# Patient Record
Sex: Male | Born: 1965 | Race: Black or African American | Hispanic: No | Marital: Married | Smoking: Current every day smoker
Health system: Southern US, Community
[De-identification: ages and names within clinical notes are randomized; demographics above are authoritative.]

## PROBLEM LIST (undated history)

## (undated) DIAGNOSIS — F4310 Post-traumatic stress disorder, unspecified: Secondary | ICD-10-CM

## (undated) DIAGNOSIS — N529 Male erectile dysfunction, unspecified: Secondary | ICD-10-CM

## (undated) DIAGNOSIS — I10 Essential (primary) hypertension: Secondary | ICD-10-CM

## (undated) DIAGNOSIS — E8881 Metabolic syndrome: Secondary | ICD-10-CM

## (undated) DIAGNOSIS — H8110 Benign paroxysmal vertigo, unspecified ear: Secondary | ICD-10-CM

## (undated) DIAGNOSIS — K219 Gastro-esophageal reflux disease without esophagitis: Secondary | ICD-10-CM

## (undated) DIAGNOSIS — G4733 Obstructive sleep apnea (adult) (pediatric): Secondary | ICD-10-CM

## (undated) DIAGNOSIS — E559 Vitamin D deficiency, unspecified: Secondary | ICD-10-CM

## (undated) DIAGNOSIS — E785 Hyperlipidemia, unspecified: Secondary | ICD-10-CM

## (undated) DIAGNOSIS — K760 Fatty (change of) liver, not elsewhere classified: Secondary | ICD-10-CM

## (undated) DIAGNOSIS — N400 Enlarged prostate without lower urinary tract symptoms: Secondary | ICD-10-CM

## (undated) DIAGNOSIS — E119 Type 2 diabetes mellitus without complications: Secondary | ICD-10-CM

## (undated) HISTORY — DX: Benign prostatic hyperplasia without lower urinary tract symptoms: N40.0

## (undated) HISTORY — DX: Gastro-esophageal reflux disease without esophagitis: K21.9

## (undated) HISTORY — DX: Hyperlipidemia, unspecified: E78.5

## (undated) HISTORY — DX: Post-traumatic stress disorder, unspecified: F43.10

## (undated) HISTORY — DX: Metabolic syndrome: E88.81

## (undated) HISTORY — DX: Male erectile dysfunction, unspecified: N52.9

## (undated) HISTORY — DX: Type 2 diabetes mellitus without complications: E11.9

## (undated) HISTORY — DX: Obstructive sleep apnea (adult) (pediatric): G47.33

## (undated) HISTORY — DX: Metabolic syndrome: E88.810

## (undated) HISTORY — DX: Fatty (change of) liver, not elsewhere classified: K76.0

## (undated) HISTORY — DX: Vitamin D deficiency, unspecified: E55.9

## (undated) HISTORY — PX: NO PAST SURGERIES: SHX2092

## (undated) HISTORY — DX: Benign paroxysmal vertigo, unspecified ear: H81.10

---

## 1998-10-22 ENCOUNTER — Ambulatory Visit (HOSPITAL_COMMUNITY): Admission: RE | Admit: 1998-10-22 | Discharge: 1998-10-22 | Payer: Self-pay | Admitting: Internal Medicine

## 1998-10-22 ENCOUNTER — Encounter: Admission: RE | Admit: 1998-10-22 | Discharge: 1998-10-22 | Payer: Self-pay | Admitting: Internal Medicine

## 1999-02-06 ENCOUNTER — Emergency Department (HOSPITAL_COMMUNITY): Admission: EM | Admit: 1999-02-06 | Discharge: 1999-02-06 | Payer: Self-pay | Admitting: Emergency Medicine

## 1999-02-14 ENCOUNTER — Encounter: Admission: RE | Admit: 1999-02-14 | Discharge: 1999-02-14 | Payer: Self-pay | Admitting: Hematology and Oncology

## 1999-03-01 ENCOUNTER — Encounter: Admission: RE | Admit: 1999-03-01 | Discharge: 1999-03-01 | Payer: Self-pay | Admitting: Internal Medicine

## 1999-03-07 ENCOUNTER — Ambulatory Visit (HOSPITAL_COMMUNITY): Admission: RE | Admit: 1999-03-07 | Discharge: 1999-03-07 | Payer: Self-pay | Admitting: *Deleted

## 1999-04-02 ENCOUNTER — Encounter: Admission: RE | Admit: 1999-04-02 | Discharge: 1999-04-02 | Payer: Self-pay | Admitting: Hematology and Oncology

## 1999-04-11 ENCOUNTER — Encounter: Admission: RE | Admit: 1999-04-11 | Discharge: 1999-07-10 | Payer: Self-pay | Admitting: *Deleted

## 1999-05-28 ENCOUNTER — Encounter: Admission: RE | Admit: 1999-05-28 | Discharge: 1999-05-28 | Payer: Self-pay | Admitting: Hematology and Oncology

## 1999-09-05 ENCOUNTER — Emergency Department (HOSPITAL_COMMUNITY): Admission: EM | Admit: 1999-09-05 | Discharge: 1999-09-05 | Payer: Self-pay | Admitting: Emergency Medicine

## 1999-09-10 ENCOUNTER — Encounter: Admission: RE | Admit: 1999-09-10 | Discharge: 1999-09-10 | Payer: Self-pay | Admitting: Internal Medicine

## 2000-10-09 ENCOUNTER — Encounter: Payer: Self-pay | Admitting: Emergency Medicine

## 2000-10-09 ENCOUNTER — Emergency Department (HOSPITAL_COMMUNITY): Admission: EM | Admit: 2000-10-09 | Discharge: 2000-10-09 | Payer: Self-pay | Admitting: *Deleted

## 2001-07-23 ENCOUNTER — Encounter: Payer: Self-pay | Admitting: Emergency Medicine

## 2001-07-23 ENCOUNTER — Emergency Department (HOSPITAL_COMMUNITY): Admission: EM | Admit: 2001-07-23 | Discharge: 2001-07-23 | Payer: Self-pay | Admitting: Emergency Medicine

## 2002-05-23 ENCOUNTER — Encounter: Payer: Self-pay | Admitting: *Deleted

## 2002-05-23 ENCOUNTER — Ambulatory Visit (HOSPITAL_COMMUNITY): Admission: RE | Admit: 2002-05-23 | Discharge: 2002-05-23 | Payer: Self-pay | Admitting: *Deleted

## 2006-11-30 ENCOUNTER — Emergency Department (HOSPITAL_COMMUNITY): Admission: EM | Admit: 2006-11-30 | Discharge: 2006-11-30 | Payer: Self-pay | Admitting: Emergency Medicine

## 2011-06-08 ENCOUNTER — Emergency Department (HOSPITAL_COMMUNITY)
Admission: EM | Admit: 2011-06-08 | Discharge: 2011-06-08 | Disposition: A | Discharge status: Home / Self Care | Payer: Self-pay | Attending: Emergency Medicine | Admitting: Emergency Medicine

## 2011-06-08 ENCOUNTER — Encounter: Payer: Self-pay | Admitting: *Deleted

## 2011-06-08 DIAGNOSIS — R05 Cough: Secondary | ICD-10-CM | POA: Insufficient documentation

## 2011-06-08 DIAGNOSIS — F10929 Alcohol use, unspecified with intoxication, unspecified: Secondary | ICD-10-CM

## 2011-06-08 DIAGNOSIS — H9190 Unspecified hearing loss, unspecified ear: Secondary | ICD-10-CM | POA: Insufficient documentation

## 2011-06-08 DIAGNOSIS — R059 Cough, unspecified: Secondary | ICD-10-CM | POA: Insufficient documentation

## 2011-06-08 DIAGNOSIS — F1010 Alcohol abuse, uncomplicated: Secondary | ICD-10-CM | POA: Insufficient documentation

## 2011-06-08 DIAGNOSIS — J3489 Other specified disorders of nose and nasal sinuses: Secondary | ICD-10-CM | POA: Insufficient documentation

## 2011-06-08 LAB — DIFFERENTIAL
Basophils Relative: 0 % (ref 0–1)
Lymphs Abs: 3.3 10*3/uL (ref 0.7–4.0)
Monocytes Relative: 9 % (ref 3–12)
Neutro Abs: 3.8 10*3/uL (ref 1.7–7.7)
Neutrophils Relative %: 48 % (ref 43–77)

## 2011-06-08 LAB — RAPID URINE DRUG SCREEN, HOSP PERFORMED
Cocaine: NOT DETECTED
Opiates: NOT DETECTED
Tetrahydrocannabinol: NOT DETECTED

## 2011-06-08 LAB — POCT I-STAT, CHEM 8
BUN: 16 mg/dL (ref 6–23)
Chloride: 109 mEq/L (ref 96–112)
Creatinine, Ser: 1.4 mg/dL — ABNORMAL HIGH (ref 0.50–1.35)
Glucose, Bld: 110 mg/dL — ABNORMAL HIGH (ref 70–99)
Hemoglobin: 16 g/dL (ref 13.0–17.0)
Potassium: 4 mEq/L (ref 3.5–5.1)
Sodium: 143 mEq/L (ref 135–145)

## 2011-06-08 LAB — URINE MICROSCOPIC-ADD ON

## 2011-06-08 LAB — URINALYSIS, ROUTINE W REFLEX MICROSCOPIC
Bilirubin Urine: NEGATIVE
Glucose, UA: NEGATIVE mg/dL
Specific Gravity, Urine: 1.026 (ref 1.005–1.030)
Urobilinogen, UA: 0.2 mg/dL (ref 0.0–1.0)

## 2011-06-08 LAB — ETHANOL: Alcohol, Ethyl (B): 162 mg/dL — ABNORMAL HIGH (ref 0–11)

## 2011-06-08 LAB — CBC
Hemoglobin: 14.9 g/dL (ref 13.0–17.0)
Platelets: 193 10*3/uL (ref 150–400)
RBC: 5.11 MIL/uL (ref 4.22–5.81)

## 2011-06-08 MED ORDER — SODIUM CHLORIDE 0.9 % IV BOLUS (SEPSIS): 1000.0000 mL | Freq: Once | INTRAVENOUS | Status: DC

## 2011-06-08 NOTE — ED Notes (Signed)
Reports can now suddenly hear, is back to normal

## 2011-06-08 NOTE — ED Notes (Signed)
To ed for eval of hearing loss. States he has had some hearing loss over the past cple years but noticed it completely gone this am. Denies pain.

## 2011-06-08 NOTE — ED Provider Notes (Signed)
Medical screening examination/treatment/procedure(s) were performed by non-physician practitioner and as supervising physician I was immediately available for consultation/collaboration.   Brogan Martis, MD 06/08/11 1531 

## 2011-06-08 NOTE — ED Notes (Signed)
Patient denies pain and is resting comfortably. Informed patient and/or family of status.

## 2011-06-08 NOTE — ED Notes (Signed)
Family at bedside. 

## 2011-06-08 NOTE — ED Provider Notes (Signed)
History    this is a 45 year old male presenting to the ED with chief complaints of hearing loss. Per family member, the patient has had trouble hearing for quite a long time. Family member has noticed that he talks louder than usual, has TV on loud, and this is chronic.  He drives truck for living. However, this morning patient noticed complete hearing loss to both the ears. Patient state hearing loss is acute.  He can hear vibrations, and noticed that sounds comes and goes. Patient denies ear pain, or headache. He does state he has some mild nasal congestions and nonproductive cough, but no fever, chest pain, shortness of breath, or abdominal pain. Patient does not take any medication on a regular basis. Per family member, he had a few alcoholic drinks last night, but is not a chronic drinker.    CSN: [id number] Arrival date & time: 06/08/2011  7:13 AM   First MD Initiated Contact with Patient 06/08/11 0719      Chief Complaint  Patient presents with  . Hearing Loss    (Consider location/radiation/quality/duration/timing/severity/associated sxs/prior treatment) HPI  History reviewed. No pertinent past medical history.  History reviewed. No pertinent past surgical history.  History reviewed. No pertinent family history.  History  Substance Use Topics  . Smoking status: Not on file  . Smokeless tobacco: Not on file  . Alcohol Use: Yes      Review of Systems  All other systems reviewed and are negative.    Allergies  Review of patient's allergies indicates not on file.  Home Medications  No current outpatient prescriptions on file.  BP 160/91  Pulse 90  Resp 16  SpO2 96%  Physical Exam  Nursing note and vitals reviewed. Constitutional: He is oriented to person, place, and time. He appears well-developed and well-nourished.       Awake, alert, nontoxic appearance  HENT:  Head: Normocephalic and atraumatic.  Right Ear: External ear normal. No drainage, swelling or  tenderness. No foreign bodies. Tympanic membrane is not perforated. A middle ear effusion is present. No hemotympanum. Decreased hearing is noted.  Left Ear: External ear normal. No drainage, swelling or tenderness. No foreign bodies. Tympanic membrane is not perforated. A middle ear effusion is present. No hemotympanum. Decreased hearing is noted.  Mouth/Throat: Oropharynx is clear and moist.  Eyes: Conjunctivae and EOM are normal. Pupils are equal, round, and reactive to light. Right eye exhibits no discharge. Left eye exhibits no discharge.  Neck: Neck supple.  Cardiovascular: Normal rate and regular rhythm.   Pulmonary/Chest: Effort normal and breath sounds normal. He exhibits no tenderness.  Abdominal: Bowel sounds are normal. There is no tenderness. There is no rebound.  Musculoskeletal: He exhibits no tenderness.       Baseline ROM, no obvious new focal weakness  Neurological: He is alert and oriented to person, place, and time. No cranial nerve deficit. He exhibits normal muscle tone. Coordination normal.       Mental status and motor strength appears baseline for patient and situation  Skin: Skin is warm and dry. No rash noted.  Psychiatric: He has a normal mood and affect.    ED Course  Procedures (including critical care time)  Labs Reviewed - No data to display No results found.   No diagnosis found.  Results for orders placed during the hospital encounter of 06/08/11  CBC      Component Value Range   WBC 8.0  4.0 - 10.5 (K/uL)   RBC 5.11  4.22 - 5.81 (MIL/uL)   Hemoglobin 14.9  13.0 - 17.0 (g/dL)   HCT 16.1  09.6 - 04.5 (%)   MCV 85.9  78.0 - 100.0 (fL)   MCH 29.2  26.0 - 34.0 (pg)   MCHC 33.9  30.0 - 36.0 (g/dL)   RDW 40.9  81.1 - 91.4 (%)   Platelets 193  150 - 400 (K/uL)  DIFFERENTIAL      Component Value Range   Neutrophils Relative 48  43 - 77 (%)   Neutro Abs 3.8  1.7 - 7.7 (K/uL)   Lymphocytes Relative 41  12 - 46 (%)   Lymphs Abs 3.3  0.7 - 4.0 (K/uL)    Monocytes Relative 9  3 - 12 (%)   Monocytes Absolute 0.7  0.1 - 1.0 (K/uL)   Eosinophils Relative 2  0 - 5 (%)   Eosinophils Absolute 0.1  0.0 - 0.7 (K/uL)   Basophils Relative 0  0 - 1 (%)   Basophils Absolute 0.0  0.0 - 0.1 (K/uL)  ETHANOL      Component Value Range   Alcohol, Ethyl (B) 162 (*) 0 - 11 (mg/dL)  URINE RAPID DRUG SCREEN (HOSP PERFORMED)      Component Value Range   Opiates NONE DETECTED  NONE DETECTED    Cocaine NONE DETECTED  NONE DETECTED    Benzodiazepines NONE DETECTED  NONE DETECTED    Amphetamines NONE DETECTED  NONE DETECTED    Tetrahydrocannabinol NONE DETECTED  NONE DETECTED    Barbiturates NONE DETECTED  NONE DETECTED   URINALYSIS, ROUTINE W REFLEX MICROSCOPIC      Component Value Range   Color, Urine YELLOW  YELLOW    APPearance CLEAR  CLEAR    Specific Gravity, Urine 1.026  1.005 - 1.030    pH 5.0  5.0 - 8.0    Glucose, UA NEGATIVE  NEGATIVE (mg/dL)   Hgb urine dipstick SMALL (*) NEGATIVE    Bilirubin Urine NEGATIVE  NEGATIVE    Ketones, ur NEGATIVE  NEGATIVE (mg/dL)   Protein, ur NEGATIVE  NEGATIVE (mg/dL)   Urobilinogen, UA 0.2  0.0 - 1.0 (mg/dL)   Nitrite NEGATIVE  NEGATIVE    Leukocytes, UA NEGATIVE  NEGATIVE   POCT I-STAT, CHEM 8      Component Value Range   Sodium 143  135 - 145 (mEq/L)   Potassium 4.0  3.5 - 5.1 (mEq/L)   Chloride 109  96 - 112 (mEq/L)   BUN 16  6 - 23 (mg/dL)   Creatinine, Ser 7.82 (*) 0.50 - 1.35 (mg/dL)   Glucose, Bld 956 (*) 70 - 99 (mg/dL)   Calcium, Ion 2.13 (*) 1.12 - 1.32 (mmol/L)   TCO2 22  0 - 100 (mmol/L)   Hemoglobin 16.0  13.0 - 17.0 (g/dL)   HCT 08.6  57.8 - 46.9 (%)  URINE MICROSCOPIC-ADD ON      Component Value Range   Squamous Epithelial / LPF RARE  RARE    WBC, UA 0-2  <3 (WBC/hpf)   RBC / HPF 0-2  <3 (RBC/hpf)   No results found.    MDM  Patient has acute on chronic hearing changes. He appears to hear vibrations greater than sounds. Questionable neurosensory loss versus conductive loss. No  obvious finding on exam. Mild fluid behind TM bilaterally with no significant signs of infection. Patient has no focal neuro deficit, however he is not acting as himself according to family members. Therefore, I will obtain an i-STAT, EtOH,  urine drug screen, and we'll continue to monitor. He appears to be in no acute distress, and his vital signs stable.   9:23 AM Patient has an elevated alcohol level on blood work, but no other obvious finding concerning for drug toxicity. No evidence of infection. Per family member, his hearing has improved and he was able to hear conversations without difficulty. Family members agrees to taking home and provide a safety net while he rest. Patient is encouraged to return to the ER if symptoms worsen. He is currently in no acute distress. Patient will be discharged.     Fayrene Helper, [place] 06/08/11 [phone]

## 2013-05-12 ENCOUNTER — Emergency Department (HOSPITAL_COMMUNITY)
Admission: EM | Admit: 2013-05-12 | Discharge: 2013-05-12 | Disposition: A | Discharge status: Home / Self Care | Payer: BC Managed Care – PPO | Source: Home / Self Care | Attending: Emergency Medicine | Admitting: Emergency Medicine

## 2013-05-12 ENCOUNTER — Emergency Department (INDEPENDENT_AMBULATORY_CARE_PROVIDER_SITE_OTHER): Payer: BC Managed Care – PPO

## 2013-05-12 DIAGNOSIS — J40 Bronchitis, not specified as acute or chronic: Secondary | ICD-10-CM

## 2013-05-12 MED ORDER — METHYLPREDNISOLONE (PAK) 4 MG PO TABS: ORAL_TABLET | ORAL | Status: DC

## 2013-05-12 MED ORDER — BENZONATATE 200 MG PO CAPS: 200.0000 mg | ORAL_CAPSULE | Freq: Three times a day (TID) | ORAL | Status: DC | PRN

## 2013-05-12 MED ORDER — ALBUTEROL SULFATE HFA 108 (90 BASE) MCG/ACT IN AERS: 2.0000 | INHALATION_SPRAY | RESPIRATORY_TRACT | Status: DC | PRN

## 2013-05-12 MED ORDER — HYDROCOD POLST-CHLORPHEN POLST 10-8 MG/5ML PO LQCR: 5.0000 mL | Freq: Every evening | ORAL | Status: DC | PRN

## 2013-05-12 MED ORDER — TADALAFIL 20 MG PO TABS: 20.0000 mg | ORAL_TABLET | Freq: Every day | ORAL | Status: DC | PRN

## 2013-05-12 NOTE — ED Provider Notes (Signed)
Medical screening examination/treatment/procedure(s) were performed by non-physician practitioner and as supervising physician I was immediately available for consultation/collaboration.  Leslee Home, M.D.  Reuben Likes, MD 05/12/13 [phone]

## 2013-05-12 NOTE — ED Provider Notes (Signed)
CSN: [id number]     Arrival date & time 05/12/13  0803 History   None    No chief complaint on file.  (Consider location/radiation/quality/duration/timing/severity/associated sxs/prior Treatment) HPI Comments: 47 year old otherwise healthy male presents complaining of having a cough for 2 weeks, getting significantly worse over the past week. Additionally, he admits to some slight sinus pressure, posterior nasal drainage, and fatigue/ill feeling. This began 2 weeks ago as just a dry cough. Cough has persisted, and in the past week has gotten more frequent. It has remained dry. In the past week, it has become associated with some chest pain with coughing. In the past week, he has also noticed some sinus pressure and postnasal drip when he wakes up. He has been feeling sick/tired in the past week also, he believes this could be because the cough is keeping him awake at night. He has taken numerous over-the-counter cough and cold medications without relief of his symptoms. He denies pleuritic chest pain, shortness of breath, NVD. No recent travel or sick contacts. He does smoke cigarettes, about a half pack a day.   Additionally, he is requesting a refill of his Cialis. He has been on 20 mg tablets in the past to take on an as-needed basis. He tried to get in to see his regular doctor but apparently that doctor has retired. The new physician and has taken over the retired physician's patient's would not refill this without having an appointment   No past medical history on file. No past surgical history on file. No family history on file. History  Substance Use Topics  . Smoking status: Current Every Day Smoker -- 0.50 packs/day for 15 years    Types: Cigarettes  . Smokeless tobacco: Not on file  . Alcohol Use: Yes     Comment: occasionally, holidays    Review of Systems  Constitutional: Positive for fatigue. Negative for fever and chills.  HENT: Positive for postnasal drip and sinus pressure.  Negative for ear pain, rhinorrhea and sore throat.   Eyes: Negative for visual disturbance.  Respiratory: Positive for cough. Negative for shortness of breath.   Cardiovascular: Positive for chest pain (with coughing only). Negative for palpitations and leg swelling.  Gastrointestinal: Negative for nausea, vomiting, abdominal pain, diarrhea and constipation.  Genitourinary: Negative for dysuria, urgency, frequency and hematuria.  Musculoskeletal: Negative for arthralgias, myalgias, neck pain and neck stiffness.  Skin: Negative for rash.  Neurological: Negative for dizziness, weakness and light-headedness.    Allergies  Review of patient's allergies indicates no known allergies.  Home Medications   Current Outpatient Rx  Name  Route  Sig  Dispense  Refill  . albuterol (PROVENTIL HFA;VENTOLIN HFA) 108 (90 BASE) MCG/ACT inhaler   Inhalation   Inhale 2 puffs into the lungs every 4 (four) hours as needed for wheezing.   1 Inhaler   0   . benzonatate (TESSALON) 200 MG capsule   Oral   Take 1 capsule (200 mg total) by mouth 3 (three) times daily as needed for cough.   30 capsule   0   . chlorpheniramine-HYDROcodone (TUSSIONEX PENNKINETIC ER) 10-8 MG/5ML LQCR   Oral   Take 5 mLs by mouth at bedtime as needed for cough.   115 mL   0   . methylPREDNIsolone (MEDROL DOSPACK) 4 MG tablet      follow package directions   21 tablet   0   . tadalafil (CIALIS) 20 MG tablet   Oral   Take 1 tablet (20  mg total) by mouth daily as needed for erectile dysfunction.   6 tablet   0    BP 143/90  Pulse 81  Temp(Src) 98.7 F (37.1 C) (Oral)  Resp 18  SpO2 97% Physical Exam  Nursing note and vitals reviewed. Constitutional: He is oriented to person, place, and time. He appears well-developed and well-nourished. No distress.  HENT:  Head: Normocephalic.  Right Ear: External ear normal.  Left Ear: External ear normal.  Nose: Nose normal.  Mouth/Throat: Oropharynx is clear and moist.  No oropharyngeal exudate.  Eyes: Conjunctivae are normal. Pupils are equal, round, and reactive to light. Right eye exhibits no discharge. Left eye exhibits no discharge. No scleral icterus.  Neck: Normal range of motion. Neck supple.  Cardiovascular: Normal rate, regular rhythm and normal heart sounds.   Pulmonary/Chest: Effort normal. No respiratory distress. He has no wheezes. He has rales (Left middle lung field).  Lymphadenopathy:    He has no cervical adenopathy.  Neurological: He is alert and oriented to person, place, and time. Coordination normal.  Skin: Skin is warm and dry. No rash noted. He is not diaphoretic.  Psychiatric: He has a normal mood and affect. Judgment normal.    ED Course  Procedures (including critical care time) Labs Review Labs Reviewed - No data to display Imaging Review Dg Chest 2 View  05/12/2013   CLINICAL DATA:  One week history of cough and congestion.  EXAM: CHEST  2 VIEW  COMPARISON:  Prior chest x-ray 10/09/2000  FINDINGS: The lungs are clear and negative for focal airspace consolidation, pulmonary edema or suspicious pulmonary nodule. No pleural effusion or pneumothorax. Cardiac and mediastinal contours are within normal limits. No acute fracture or lytic or blastic osseous lesions. The visualized upper abdominal bowel gas pattern is unremarkable.  IMPRESSION: No active cardiopulmonary disease.   Electronically Signed   By: Malachy Moan M.D.   On: 05/12/2013 08:51      MDM   1. Bronchitis    No radiographic evidence of pneumonia. Treat the cough with Tessalon Perles in the daytime and Tussionex at night, also Medrol Dosepak and Ventolin. Will refill a few tablets of the Cialis and he should followup with his new primary care physician    Meds ordered this encounter  Medications  . benzonatate (TESSALON) 200 MG capsule    Sig: Take 1 capsule (200 mg total) by mouth 3 (three) times daily as needed for cough.    Dispense:  30 capsule     Refill:  0    Order Specific Question:  Supervising Provider    Answer:  Lorenz Coaster, DAVID C [id number]  . chlorpheniramine-HYDROcodone (TUSSIONEX PENNKINETIC ER) 10-8 MG/5ML LQCR    Sig: Take 5 mLs by mouth at bedtime as needed for cough.    Dispense:  115 mL    Refill:  0    Order Specific Question:  Supervising Provider    Answer:  Lorenz Coaster, DAVID C [id number]  . albuterol (PROVENTIL HFA;VENTOLIN HFA) 108 (90 BASE) MCG/ACT inhaler    Sig: Inhale 2 puffs into the lungs every 4 (four) hours as needed for wheezing.    Dispense:  1 Inhaler    Refill:  0    Order Specific Question:  Supervising Provider    Answer:  Lorenz Coaster, DAVID C [id number]  . methylPREDNIsolone (MEDROL DOSPACK) 4 MG tablet    Sig: follow package directions    Dispense:  21 tablet    Refill:  0    Order Specific  Question:  Supervising Provider    Answer:  Lorenz Coaster, DAVID C [id number]  . tadalafil (CIALIS) 20 MG tablet    Sig: Take 1 tablet (20 mg total) by mouth daily as needed for erectile dysfunction.    Dispense:  6 tablet    Refill:  0    Order Specific Question:  Supervising Provider    Answer:  Lorenz Coaster, DAVID C [[id number]]       Graylon Good, PA-C 05/12/13 [phone]

## 2013-05-12 NOTE — ED Notes (Signed)
C/o cough for 2 weeks States he has been taking cough medicine but no relief Denies nasal drainage. Non productive.

## 2013-05-23 ENCOUNTER — Emergency Department (HOSPITAL_COMMUNITY): Payer: BC Managed Care – PPO

## 2013-05-23 ENCOUNTER — Encounter (HOSPITAL_COMMUNITY): Payer: Self-pay | Admitting: Emergency Medicine

## 2013-05-23 ENCOUNTER — Emergency Department (HOSPITAL_COMMUNITY)
Admission: EM | Admit: 2013-05-23 | Discharge: 2013-05-23 | Disposition: A | Discharge status: Home / Self Care | Payer: BC Managed Care – PPO | Attending: Emergency Medicine | Admitting: Emergency Medicine

## 2013-05-23 ENCOUNTER — Other Ambulatory Visit: Payer: Self-pay

## 2013-05-23 DIAGNOSIS — J209 Acute bronchitis, unspecified: Secondary | ICD-10-CM | POA: Insufficient documentation

## 2013-05-23 DIAGNOSIS — F17200 Nicotine dependence, unspecified, uncomplicated: Secondary | ICD-10-CM | POA: Insufficient documentation

## 2013-05-23 DIAGNOSIS — R404 Transient alteration of awareness: Secondary | ICD-10-CM | POA: Insufficient documentation

## 2013-05-23 DIAGNOSIS — R55 Syncope and collapse: Secondary | ICD-10-CM | POA: Insufficient documentation

## 2013-05-23 DIAGNOSIS — R05 Cough: Secondary | ICD-10-CM

## 2013-05-23 DIAGNOSIS — J40 Bronchitis, not specified as acute or chronic: Secondary | ICD-10-CM

## 2013-05-23 LAB — BASIC METABOLIC PANEL
BUN: 17 mg/dL (ref 6–23)
CO2: 25 mEq/L (ref 19–32)
Chloride: 104 mEq/L (ref 96–112)
Creatinine, Ser: 1.32 mg/dL (ref 0.50–1.35)
GFR calc Af Amer: 74 mL/min — ABNORMAL LOW (ref >90)
Potassium: 4.3 mEq/L (ref 3.5–5.1)

## 2013-05-23 LAB — CBC WITH DIFFERENTIAL/PLATELET
Basophils Relative: 0 % (ref 0–1)
HCT: 44.1 % (ref 39.0–52.0)
Hemoglobin: 15 g/dL (ref 13.0–17.0)
Lymphocytes Relative: 26 % (ref 12–46)
Lymphs Abs: 2.3 10*3/uL (ref 0.7–4.0)
MCHC: 34 g/dL (ref 30.0–36.0)
Monocytes Absolute: 0.9 10*3/uL (ref 0.1–1.0)
Monocytes Relative: 10 % (ref 3–12)
Neutro Abs: 5.3 10*3/uL (ref 1.7–7.7)
Neutrophils Relative %: 62 % (ref 43–77)
RBC: 4.98 MIL/uL (ref 4.22–5.81)
WBC: 8.6 10*3/uL (ref 4.0–10.5)

## 2013-05-23 MED ORDER — AZITHROMYCIN 250 MG PO TABS: 250.0000 mg | ORAL_TABLET | Freq: Every day | ORAL | Status: DC

## 2013-05-23 MED ORDER — AZITHROMYCIN 250 MG PO TABS
500.0000 mg | ORAL_TABLET | Freq: Once | ORAL | Status: AC
  Administered 2013-05-23: 500 mg via ORAL
  Filled 2013-05-23: qty 2

## 2013-05-23 MED ORDER — ALBUTEROL SULFATE (5 MG/ML) 0.5% IN NEBU
2.5000 mg | INHALATION_SOLUTION | Freq: Once | RESPIRATORY_TRACT | Status: AC
  Administered 2013-05-23: 2.5 mg via RESPIRATORY_TRACT
  Filled 2013-05-23: qty 0.5

## 2013-05-23 NOTE — ED Notes (Signed)
EMS dispatch called regarding pt's meds.  Family felt that EMS brought meds to hospital.  EMS dispatch verified that meds were left in the living room of the pt's home.  Family advised.

## 2013-05-23 NOTE — ED Provider Notes (Signed)
CSN: [id number]     Arrival date & time 05/23/13  0043 History   First MD Initiated Contact with Patient 05/23/13 0131     Chief Complaint  Patient presents with  . Shortness of Breath  . Loss of Consciousness   (Consider location/radiation/quality/duration/timing/severity/associated sxs/prior Treatment) HPI 47 year old male presents emergency department from home via EMS after syncopal event.  Patient reports he has had cough for the last 10 days.  He was seen last week.  The urgent care and diagnosed with bronchitis.  He is given albuterol inhaler, Tessalon Perles, Tussionex.  He reports no improvement in his symptoms.  He has had hot and cold chills, but no specific fever.  Tonight, patient has been more short of breath.  He was walking to the bathroom when he had a sudden coughing spell.  Patient then woke up on the bathroom floor.  He had an episode of nausea and vomiting and shortness of breath.  This since resolved.  No chest pain.  Patient reports persistent cough and shortness of breath associated with bronchitis.  Patient reports family history of congestive heart failure, and diabetes. He has no medical history.  He is a half pack a day smoker History reviewed. No pertinent past medical history. History reviewed. No pertinent past surgical history. No family history on file. History  Substance Use Topics  . Smoking status: Current Every Day Smoker -- 0.50 packs/day for 15 years    Types: Cigarettes  . Smokeless tobacco: Not on file  . Alcohol Use: Yes     Comment: occasionally, holidays    Review of Systems  See History of Present Illness; otherwise all other systems are reviewed and negative  Allergies  Review of patient's allergies indicates no known allergies.  Home Medications   Current Outpatient Rx  Name  Route  Sig  Dispense  Refill  . albuterol (PROVENTIL HFA;VENTOLIN HFA) 108 (90 BASE) MCG/ACT inhaler   Inhalation   Inhale 2 puffs into the lungs every 4 (four)  hours as needed for wheezing.   1 Inhaler   0   . benzonatate (TESSALON) 200 MG capsule   Oral   Take 1 capsule (200 mg total) by mouth 3 (three) times daily as needed for cough.   30 capsule   0   . chlorpheniramine-HYDROcodone (TUSSIONEX PENNKINETIC ER) 10-8 MG/5ML LQCR   Oral   Take 5 mLs by mouth at bedtime as needed for cough.   115 mL   0   . tadalafil (CIALIS) 20 MG tablet   Oral   Take 1 tablet (20 mg total) by mouth daily as needed for erectile dysfunction.   6 tablet   0    BP 151/79  Pulse 92  Temp(Src) 98.3 F (36.8 C) (Oral)  Resp 16  Ht 6\' 1"  (1.854 m)  Wt 250 lb (113.399 kg)  BMI 32.99 kg/m2  SpO2 97% Physical Exam  Constitutional: He is oriented to person, place, and time. He appears well-developed and well-nourished. He appears distressed (uncomfortable appearing).  HENT:  Head: Normocephalic and atraumatic.  Right Ear: External ear normal.  Left Ear: External ear normal.  Nose: Nose normal.  Mouth/Throat: Oropharynx is clear and moist.  Eyes: Conjunctivae and EOM are normal. Pupils are equal, round, and reactive to light.  Neck: Normal range of motion. Neck supple. No JVD present. No tracheal deviation present. No thyromegaly present.  Cardiovascular: Normal rate, regular rhythm, normal heart sounds and intact distal pulses.  Exam reveals no gallop  and no friction rub.   No murmur heard. Pulmonary/Chest: Effort normal and breath sounds normal. No stridor. No respiratory distress. He has no wheezes. He has no rales. He exhibits no tenderness.  Patient has rhonchi and cough  Abdominal: Soft. Bowel sounds are normal. He exhibits no distension and no mass. There is no tenderness. There is no rebound and no guarding.  Musculoskeletal: Normal range of motion. He exhibits no edema and no tenderness.  Lymphadenopathy:    He has no cervical adenopathy.  Neurological: He is alert and oriented to person, place, and time. No cranial nerve deficit. He exhibits  normal muscle tone. Coordination normal.  Skin: Skin is warm and dry. No rash noted. No erythema. No pallor.  Psychiatric: He has a normal mood and affect. His behavior is normal. Judgment and thought content normal.    ED Course  Procedures (including critical care time) Labs Review Labs Reviewed  BASIC METABOLIC PANEL - Abnormal; Notable for the following:    Glucose, Bld 108 (*)    GFR calc non Af Amer 64 (*)    GFR calc Af Amer 74 (*)    All other components within normal limits  CBC WITH DIFFERENTIAL  TROPONIN I  D-DIMER, QUANTITATIVE   Imaging Review Dg Chest Port 1 View  05/23/2013   CLINICAL DATA:  Shortness of breath and syncope.  EXAM: PORTABLE CHEST - 1 VIEW  COMPARISON:  05/12/2013  FINDINGS: Shallow inspiration. Heart size and pulmonary vascularity are normal. No evidence of consolidation or airspace disease in the lungs. No blunting of costophrenic angles.  IMPRESSION: No active disease.   Electronically Signed   By: Burman Nieves M.D.   On: 05/23/2013 02:13    EKG Interpretation    Date/Time:  Monday May 23 2013 02:01:41 EST Ventricular Rate:  108 PR Interval:  144 QRS Duration: 75 QT Interval:  330 QTC Calculation: 442 R Axis:   65 Text Interpretation:  Sinus tachycardia Anteroseptal infarct, old Confirmed by Herberth Deharo  MD, Yojan Paskett ([id number]) on 05/23/2013 2:03:59 AM            MDM   1. Cough syncope   2. Bronchitis    47 year old male with episode of syncope was a click related to his cough.  There is some rhonchi.  On exam.  He had chest x-ray done on the 13th that did not show any infiltrate.  Will repeat chest x-ray today.  Will also get baseline labs, and d-dimer.  Will give breathing treatment here to see if that helps with his cough.    Olivia Mackie, MD 05/23/13 [phone]

## 2013-08-22 ENCOUNTER — Telehealth: Payer: Self-pay

## 2013-08-22 NOTE — Telephone Encounter (Signed)
Left message for call back Non-identifiable  New patient 

## 2013-08-23 ENCOUNTER — Ambulatory Visit: Payer: BC Managed Care – PPO | Admitting: Internal Medicine

## 2013-08-26 ENCOUNTER — Ambulatory Visit: Payer: BC Managed Care – PPO | Admitting: Internal Medicine

## 2013-08-29 NOTE — Telephone Encounter (Signed)
Appt cancelled and rescheduled

## 2013-09-16 ENCOUNTER — Telehealth: Payer: Self-pay

## 2013-09-16 NOTE — Telephone Encounter (Signed)
Medication List and allergies:  Reviewed and updated  90 day supply/mail order: na Local prescriptions: [facility]  Immunizations due: see below  A/P:   Entered FH, PSH an personal hx Flu vaccine--did not get this season Tdap--3-4 years ago Family history of HTN--father and 2 brothers  To Discuss with Provider: Not at this time

## 2013-09-19 ENCOUNTER — Ambulatory Visit (INDEPENDENT_AMBULATORY_CARE_PROVIDER_SITE_OTHER): Payer: BC Managed Care – PPO | Admitting: Internal Medicine

## 2013-09-19 ENCOUNTER — Encounter: Payer: Self-pay | Admitting: Internal Medicine

## 2013-09-19 VITALS — BP 145/93 | HR 70 | Temp 97.9°F | Ht 74.5 in | Wt 254.4 lb

## 2013-09-19 DIAGNOSIS — R059 Cough, unspecified: Secondary | ICD-10-CM

## 2013-09-19 DIAGNOSIS — R05 Cough: Secondary | ICD-10-CM

## 2013-09-19 MED ORDER — ALBUTEROL SULFATE HFA 108 (90 BASE) MCG/ACT IN AERS: 2.0000 | INHALATION_SPRAY | Freq: Four times a day (QID) | RESPIRATORY_TRACT | Status: DC | PRN

## 2013-09-19 MED ORDER — OMEPRAZOLE 40 MG PO CPDR: 40.0000 mg | DELAYED_RELEASE_CAPSULE | Freq: Every day | ORAL | Status: DC

## 2013-09-19 NOTE — Progress Notes (Signed)
Subjective:    Patient ID: Wesley Swanson, male    DOB: 1965-11-24, 48 y.o.   MRN: [id number]  DOS:  09/19/2013 Type of  visit: New patient His main concern is cough, having cough since approximately November when he was diagnosed with bronchitis, sx are  day or night, usually dry w/  no sputum. On chart review, he went to the ER in November after a syncopal episode in the setting of bronchitis. Chest x-ray, d-dimer and EKG were within normal; no further syncope episodes.    ROS Denies sinus pain, congestion or postnasal dripping. He has quite noticeable heartburn approximately once a month. He has gained weight lately. Denies dysphasia or odynophagia. Admits to occasional wheezing although has no history of asthma.   Past Medical History  Diagnosis Date  . ED (erectile dysfunction)     Past Surgical History  Procedure Laterality Date  . No past surgeries      History   Social History  . Marital Status: Married    Spouse Name: Natalia Leatherwood     Number of Children: 0  . Years of Education: N/A   Occupational History  . [employer] doctor    Social History Main Topics  . Smoking status: Current Every Day Smoker -- 1.00 packs/day for 18 years    Types: Cigarettes  . Smokeless tobacco: Never Used     Comment: 1 ppd  . Alcohol Use: 1.5 - 2 oz/week    3-4 drink(s) per week     Comment: occasionally, holidays  . Drug Use: No  . Sexual Activity: Not on file   Other Topics Concern  . Not on file   Social History Narrative  . No narrative on file     Family History  Problem Relation Age of Onset  . Hypertension Father   . Diabetes Father   . Hypertension Brother   . CAD Father     MI age 98  . Stroke Neg Hx   . Colon cancer Neg Hx   . Prostate cancer Neg Hx        Medication List       This list is accurate as of: 09/19/13  6:49 PM.  Always use your most recent med list.               albuterol 108 (90 BASE) MCG/ACT inhaler  Commonly known as:  VENTOLIN  HFA  Inhale 2 puffs into the lungs every 6 (six) hours as needed for wheezing or shortness of breath.     omeprazole 40 MG capsule  Commonly known as:  PRILOSEC  Take 1 capsule (40 mg total) by mouth daily.     tadalafil 20 MG tablet  Commonly known as:  CIALIS  Take 1 tablet (20 mg total) by mouth daily as needed for erectile dysfunction.           Objective:   Physical Exam BP 145/93  Pulse 70  Temp(Src) 97.9 F (36.6 C) (Oral)  Ht 6' 2.5" (1.892 m)  Wt 254 lb 6.4 oz (115.395 kg)  BMI 32.24 kg/m2  SpO2 97% General -- alert, well-developed, NAD.   HEENT-- Not pale. TMs normal, throat symmetric, no redness or discharge. Face symmetric, sinuses not tender to palpation. Nose not congested. Lungs -- normal respiratory effort, no intercostal retractions, no accessory muscle use, and normal breath sounds.  Heart-- normal rate, regular rhythm, no murmur.  Abdomen-- Not distended, good bowel sounds,soft, non-tender.  Extremities-- no pretibial edema bilaterally  Neurologic--  alert & oriented X3. Speech normal, gait normal, strength normal in all extremities.  Psych-- Cognition and judgment appear intact. Cooperative with normal attention span and concentration. No anxious or depressed appearing.     Assessment & Plan:

## 2013-09-19 NOTE — Progress Notes (Signed)
Pre visit review using our clinic review tool, if applicable. No additional management support is needed unless otherwise documented below in the visit note. 

## 2013-09-19 NOTE — Assessment & Plan Note (Addendum)
48 year old gentleman, smoker, present with chronic cough since November after he had bronchitis. Review of systems is + for occ sx of GERD and wheezing. Plan: Chest x-ray PFTs if no better  Tobacco cessation discussed today Mucinex DM twice a day x 1 week PPIs daily, albuterol if wheezing, reassess in one month when he comes back for a physical

## 2013-09-19 NOTE — Patient Instructions (Signed)
Get the XR at [facility] [place], corner of [address] (10 minutes form here); they are open 24/7 [address]  [place], [place] [id number] [phone]   Mucinex DM one tablet twice a day for one week Omeprazole 40 mg one tablet before breakfast every day Albuterol, 2 puffs as needed up to 4 times a day for cough and wheezing.   Next visit is for a physical exam in 4 weeks,  fasting Please make an appointment

## 2013-10-07 ENCOUNTER — Ambulatory Visit (HOSPITAL_BASED_OUTPATIENT_CLINIC_OR_DEPARTMENT_OTHER)
Admission: RE | Admit: 2013-10-07 | Discharge: 2013-10-07 | Disposition: A | Discharge status: Home / Self Care | Payer: BC Managed Care – PPO | Source: Ambulatory Visit | Attending: Internal Medicine | Admitting: Internal Medicine

## 2013-10-07 ENCOUNTER — Telehealth: Payer: Self-pay | Admitting: *Deleted

## 2013-10-07 DIAGNOSIS — J14 Pneumonia due to Hemophilus influenzae: Secondary | ICD-10-CM

## 2013-10-07 DIAGNOSIS — J189 Pneumonia, unspecified organism: Secondary | ICD-10-CM

## 2013-10-07 DIAGNOSIS — R059 Cough, unspecified: Secondary | ICD-10-CM | POA: Insufficient documentation

## 2013-10-07 DIAGNOSIS — R05 Cough: Secondary | ICD-10-CM | POA: Insufficient documentation

## 2013-10-07 MED ORDER — DOXYCYCLINE HYCLATE 100 MG PO TABS: 100.0000 mg | ORAL_TABLET | Freq: Two times a day (BID) | ORAL | Status: DC

## 2013-10-07 NOTE — Telephone Encounter (Signed)
Pt notified of lab results and rx sent.

## 2013-10-17 ENCOUNTER — Telehealth: Payer: Self-pay | Admitting: *Deleted

## 2013-10-17 NOTE — Telephone Encounter (Signed)
No changes since last month's call.

## 2013-10-17 NOTE — Telephone Encounter (Signed)
Wende MottGaye H Foster, RN at 09/16/2013 11:45 AM     Status: Signed               Medication List and allergies: Reviewed and updated  90 day supply/mail order: na  [facility]  Immunizations due: see below  A/P:  Entered FH, PSH an personal hx  Flu vaccine--did not get this season  Tdap--3-4 years ago  Family history of HTN--father and 2 brothers  To Discuss with Provider:  Not at this time

## 2013-10-18 ENCOUNTER — Ambulatory Visit (INDEPENDENT_AMBULATORY_CARE_PROVIDER_SITE_OTHER): Payer: BC Managed Care – PPO | Admitting: Internal Medicine

## 2013-10-18 ENCOUNTER — Encounter: Payer: Self-pay | Admitting: Internal Medicine

## 2013-10-18 VITALS — BP 151/96 | HR 86 | Temp 97.9°F | Ht 72.8 in | Wt 251.0 lb

## 2013-10-18 DIAGNOSIS — Z0000 Encounter for general adult medical examination without abnormal findings: Secondary | ICD-10-CM

## 2013-10-18 DIAGNOSIS — R030 Elevated blood-pressure reading, without diagnosis of hypertension: Secondary | ICD-10-CM

## 2013-10-18 DIAGNOSIS — R059 Cough, unspecified: Secondary | ICD-10-CM

## 2013-10-18 DIAGNOSIS — R05 Cough: Secondary | ICD-10-CM

## 2013-10-18 DIAGNOSIS — I10 Essential (primary) hypertension: Secondary | ICD-10-CM | POA: Insufficient documentation

## 2013-10-18 DIAGNOSIS — IMO0001 Reserved for inherently not codable concepts without codable children: Secondary | ICD-10-CM

## 2013-10-18 LAB — COMPREHENSIVE METABOLIC PANEL
ALT: 56 U/L — AB (ref 0–53)
AST: 54 U/L — ABNORMAL HIGH (ref 0–37)
Albumin: 4.3 g/dL (ref 3.5–5.2)
Alkaline Phosphatase: 86 U/L (ref 39–117)
BILIRUBIN TOTAL: 0.8 mg/dL (ref 0.3–1.2)
BUN: 14 mg/dL (ref 6–23)
CO2: 25 meq/L (ref 19–32)
CREATININE: 1.3 mg/dL (ref 0.4–1.5)
Calcium: 9.3 mg/dL (ref 8.4–10.5)
Chloride: 104 mEq/L (ref 96–112)
GFR: 78.45 mL/min (ref >60.00)
Glucose, Bld: 90 mg/dL (ref 70–99)
Potassium: 4.1 mEq/L (ref 3.5–5.1)
Sodium: 138 mEq/L (ref 135–145)
Total Protein: 7.6 g/dL (ref 6.0–8.3)

## 2013-10-18 LAB — TSH: TSH: 1.15 u[IU]/mL (ref 0.35–5.50)

## 2013-10-18 LAB — LIPID PANEL
CHOL/HDL RATIO: 4
Cholesterol: 179 mg/dL (ref 0–200)
HDL: 50.9 mg/dL (ref >39.00)
LDL Cholesterol: 113 mg/dL — ABNORMAL HIGH (ref 0–99)
TRIGLYCERIDES: 78 mg/dL (ref 0.0–149.0)
VLDL: 15.6 mg/dL (ref 0.0–40.0)

## 2013-10-18 NOTE — Patient Instructions (Addendum)
Get your blood work before you leave   Need to do a chest XR by 11-06-13, please call the office   Check the  blood pressure 2 or 3 times a month   be sure it is between 110/60 and 140/85. Ideal blood pressure is 120/80. If it is consistently higher or lower, let me know  Next visit is for routine check up regards cough in 6 months  No need to come back fasting Please make an appointment      Smoking Cessation Quitting smoking is important to your health and has many advantages. However, it is not always easy to quit since nicotine is a very addictive drug. Often times, people try 3 times or more before being able to quit. This document explains the best ways for you to prepare to quit smoking. Quitting takes hard work and a lot of effort, but you can do it. ADVANTAGES OF QUITTING SMOKING  You will live longer, feel better, and live better.  Your body will feel the impact of quitting smoking almost immediately.  Within 20 minutes, blood pressure decreases. Your pulse returns to its normal level.  After 8 hours, carbon monoxide levels in the blood return to normal. Your oxygen level increases.  After 24 hours, the chance of having a heart attack starts to decrease. Your breath, hair, and body stop smelling like smoke.  After 48 hours, damaged nerve endings begin to recover. Your sense of taste and smell improve.  After 72 hours, the body is virtually free of nicotine. Your bronchial tubes relax and breathing becomes easier.  After 2 to 12 weeks, lungs can hold more air. Exercise becomes easier and circulation improves.  The risk of having a heart attack, stroke, cancer, or lung disease is greatly reduced.  After 1 year, the risk of coronary heart disease is cut in half.  After 5 years, the risk of stroke falls to the same as a nonsmoker.  After 10 years, the risk of lung cancer is cut in half and the risk of other cancers decreases significantly.  After 15 years, the risk of  coronary heart disease drops, usually to the level of a nonsmoker.  If you are pregnant, quitting smoking will improve your chances of having a healthy baby.  The people you live with, especially any children, will be healthier.  You will have extra money to spend on things other than cigarettes. QUESTIONS TO THINK ABOUT BEFORE ATTEMPTING TO QUIT You may want to talk about your answers with your caregiver.  Why do you want to quit?  If you tried to quit in the past, what helped and what did not?  What will be the most difficult situations for you after you quit? How will you plan to handle them?  Who can help you through the tough times? Your family? Friends? A caregiver?  What pleasures do you get from smoking? What ways can you still get pleasure if you quit? Here are some questions to ask your caregiver:  How can you help me to be successful at quitting?  What medicine do you think would be best for me and how should I take it?  What should I do if I need more help?  What is smoking withdrawal like? How can I get information on withdrawal? GET READY  Set a quit date.  Change your environment by getting rid of all cigarettes, ashtrays, matches, and lighters in your home, car, or work. Do not let people smoke in  your home.  Review your past attempts to quit. Think about what worked and what did not. GET SUPPORT AND ENCOURAGEMENT You have a better chance of being successful if you have help. You can get support in many ways.  Tell your family, friends, and co-workers that you are going to quit and need their support. Ask them not to smoke around you.  Get individual, group, or telephone counseling and support. Programs are available at [facility]. Call your local health department for information about programs in your area.  Spiritual beliefs and practices may help some smokers quit.  Download a "quit meter" on your computer to keep track of quit  statistics, such as how long you have gone without smoking, cigarettes not smoked, and money saved.  Get a self-help book about quitting smoking and staying off of tobacco. LEARN NEW SKILLS AND BEHAVIORS  Distract yourself from urges to smoke. Talk to someone, go for a walk, or occupy your time with a task.  Change your normal routine. Take a different route to work. Drink tea instead of coffee. Eat breakfast in a different place.  Reduce your stress. Take a hot bath, exercise, or read a book.  Plan something enjoyable to do every day. Reward yourself for not smoking.  Explore interactive web-based programs that specialize in helping you quit. GET MEDICINE AND USE IT CORRECTLY Medicines can help you stop smoking and decrease the urge to smoke. Combining medicine with the above behavioral methods and support can greatly increase your chances of successfully quitting smoking.  Nicotine replacement therapy helps deliver nicotine to your body without the negative effects and risks of smoking. Nicotine replacement therapy includes nicotine gum, lozenges, inhalers, nasal sprays, and skin patches. Some may be available over-the-counter and others require a prescription.  Antidepressant medicine helps people abstain from smoking, but how this works is unknown. This medicine is available by prescription.  Nicotinic receptor partial agonist medicine simulates the effect of nicotine in your brain. This medicine is available by prescription. Ask your caregiver for advice about which medicines to use and how to use them based on your health history. Your caregiver will tell you what side effects to look out for if you choose to be on a medicine or therapy. Carefully read the information on the package. Do not use any other product containing nicotine while using a nicotine replacement product.  RELAPSE OR DIFFICULT SITUATIONS Most relapses occur within the first 3 months after quitting. Do not be  discouraged if you start smoking again. Remember, most people try several times before finally quitting. You may have symptoms of withdrawal because your body is used to nicotine. You may crave cigarettes, be irritable, feel very hungry, cough often, get headaches, or have difficulty concentrating. The withdrawal symptoms are only temporary. They are strongest when you first quit, but they will go away within 10 14 days. To reduce the chances of relapse, try to:  Avoid drinking alcohol. Drinking lowers your chances of successfully quitting.  Reduce the amount of caffeine you consume. Once you quit smoking, the amount of caffeine in your body increases and can give you symptoms, such as a rapid heartbeat, sweating, and anxiety.  Avoid smokers because they can make you want to smoke.  Do not let weight gain distract you. Many smokers will gain weight when they quit, usually less than 10 pounds. Eat a healthy diet and stay active. You can always lose the weight gained after you quit.  Find ways to improve your mood other than smoking. FOR MORE INFORMATION  www.smokefree.gov  Document Released: 06/10/2001 Document Revised: 12/16/2011 Document Reviewed: 09/25/2011 [facility] Patient Information 2014 [place], [place].

## 2013-10-18 NOTE — Assessment & Plan Note (Addendum)
Since the last time he was here, he was found to have pneumonia, status post doxycycline. Cough much improved, almost gone. Denies wheezing or GERD consequently pt decided not to use albuterol, omeprazole. Plan: Chest x-ray  11-06-13

## 2013-10-18 NOTE — Assessment & Plan Note (Addendum)
Td ~ 2010 per pt Never had a cscope  Tobacco -- improving, has cut down, praised  Diet-exercise discussed  Labs

## 2013-10-18 NOTE — Progress Notes (Signed)
   Subjective:    Patient ID: Wesley Swanson, male    DOB: 08/27/1965, 48 y.o.   MRN: [id number]  DOS:  10/18/2013 Type of  visit: CPX Cough-PNM-- see a/p   ROS Diet, Exercise-- planning to improve diet, run (has gained wt since he change jobs) No  CP, SOB No palpitations, no lower extremity edema Denies  nausea, vomiting diarrhea No abdominal pain Denies  blood in the stools No GERD  Sx. No dysphagia or odynophagia (-)  sputum production (-) wheezing, chest congestion No dysuria, gross hematuria, difficulty urinating  No anxiety, depression    Past Medical History  Diagnosis Date  . ED (erectile dysfunction)     Past Surgical History  Procedure Laterality Date  . No past surgeries      History   Social History  . Marital Status: Married    Spouse Name: Natalia LeatherwoodKatherine     Number of Children: 0  . Years of Education: N/A   Occupational History  . 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 Smoking status: Current Every Day Smoker -- 1.00 packs/day for 18 years    Types: Cigarettes  . Smokeless tobacco: Never Used     Comment: 1 ppd  . Alcohol Use: 1.5 - 2.0 oz/week    3-4 drink(s) per week     Comment: occasionally, holidays  . Drug Use: No  . Sexual Activity: Not on file   Other Topics Concern  . Not on file   Social History Narrative  . No narrative on file     Family History  Problem Relation Age of Onset  . Hypertension Father   . Diabetes Father   . Hypertension Brother   . CAD Father     MI age 46  . Stroke Neg Hx   . Colon cancer Neg Hx   . Prostate cancer Neg Hx       Medication List       This list is accurate as of: 10/18/13  9:31 PM.  Always use your most recent med list.               albuterol 108 (90 BASE) MCG/ACT inhaler  Commonly known as:  VENTOLIN HFA  Inhale 2 puffs into the lungs every 6 (six) hours as needed for wheezing or shortness of breath.     tadalafil 20 MG tablet  Commonly known as:  CIALIS    Take 1 tablet (20 mg total) by mouth daily as needed for erectile dysfunction.           Objective:   Physical Exam BP 151/96  Pulse 86  Temp(Src) 97.9 F (36.6 C)  Ht 6' 0.8" (1.849 m)  Wt 251 lb (113.853 kg)  BMI 33.30 kg/m2  SpO2 99%  General -- alert, well-developed, NAD.  Neck --no thyromegaly  HEENT-- Not pale.  Lungs -- normal respiratory effort, no intercostal retractions, no accessory muscle use, and normal breath sounds.  Heart-- normal rate, regular rhythm, no murmur.  Abdomen-- Not distended, good bowel sounds,soft, non-tender. Neurologic--  alert & oriented X3. Speech normal, gait normal, strength normal in all extremities.  Psych-- Cognition and judgment appear intact. Cooperative with normal attention span and concentration. No anxious or depressed appearing.       Assessment & Plan:

## 2013-10-18 NOTE — Assessment & Plan Note (Signed)
BP elevated today, patient assures me that he take his blood pressure in the ambulatory setting and always gets readings of ~ 128/80 Plan: Continue monitoring

## 2013-10-20 ENCOUNTER — Encounter: Payer: Self-pay | Admitting: *Deleted

## 2013-10-21 ENCOUNTER — Telehealth: Payer: Self-pay | Admitting: Internal Medicine

## 2013-10-21 MED ORDER — TADALAFIL 20 MG PO TABS: 20.0000 mg | ORAL_TABLET | Freq: Every day | ORAL | Status: DC | PRN

## 2013-10-21 NOTE — Telephone Encounter (Signed)
Done

## 2013-10-21 NOTE — Telephone Encounter (Signed)
Caller name:Pearly Cobbins Relation to ZO:XWRUEAVpt:patient PCP:Dr Paz Call back [phone] [facility]acy[id number] [place], [place] - [address]   Reason for call: Patient called to request a refill for Cialis

## 2013-11-12 ENCOUNTER — Telehealth: Payer: Self-pay | Admitting: Internal Medicine

## 2013-11-12 NOTE — Telephone Encounter (Signed)
Needs a CXR-- dx pneumonia Please arrange

## 2013-11-18 NOTE — Telephone Encounter (Signed)
CXR already ordered. Pt states that he forgot will try to get cxr done as soon as he can.

## 2013-11-22 ENCOUNTER — Encounter: Payer: Self-pay | Admitting: Podiatry

## 2013-11-22 ENCOUNTER — Ambulatory Visit (INDEPENDENT_AMBULATORY_CARE_PROVIDER_SITE_OTHER): Payer: BC Managed Care – PPO | Admitting: Podiatry

## 2013-11-22 ENCOUNTER — Ambulatory Visit (INDEPENDENT_AMBULATORY_CARE_PROVIDER_SITE_OTHER): Payer: BC Managed Care – PPO

## 2013-11-22 VITALS — BP 155/104 | HR 82 | Resp 16 | Ht 73.0 in | Wt 251.0 lb

## 2013-11-22 DIAGNOSIS — M779 Enthesopathy, unspecified: Secondary | ICD-10-CM

## 2013-11-22 DIAGNOSIS — L84 Corns and callosities: Secondary | ICD-10-CM

## 2013-11-22 DIAGNOSIS — M7750 Other enthesopathy of unspecified foot: Secondary | ICD-10-CM

## 2013-11-22 DIAGNOSIS — M778 Other enthesopathies, not elsewhere classified: Secondary | ICD-10-CM

## 2013-11-22 DIAGNOSIS — M898X9 Other specified disorders of bone, unspecified site: Secondary | ICD-10-CM

## 2013-11-22 DIAGNOSIS — M2040 Other hammer toe(s) (acquired), unspecified foot: Secondary | ICD-10-CM

## 2013-11-22 NOTE — Progress Notes (Signed)
   Subjective:    Patient ID: Wesley Swanson, male    DOB: 1965/08/31, 48 y.o.   MRN: [id number]  HPI Comments: "I have pain in the toe and underneath the foot"  Patient c/o aching sub 5th MPJ and 5th toe right foot for few months. There is a callused area in between the 4th and 5th toes and sub 5th MPJ. He has had hammer toe surgery in the past for a corn. Shoes are uncomfortable, makes toes rub together. He has used OTC corn drops that made it turn white and peel.  Toe Pain       Review of Systems  Respiratory: Positive for cough.   All other systems reviewed and are negative.      Objective:   Physical Exam: I have reviewed his past medical history medications allergies surgeries social history and review of systems. Pulses are palpable. Neurologic sensorium is intact per since once the monofilament left foot. Deep tendon reflexes are intact left foot. Orthopedic evaluation demonstrates all joints distal to the ankle have a full range of motion without crepitation. A history of an arthroplasty fifth digit left foot is currently resulting in a soft tissue increase in density or soft corn between the fourth and fifth toes of the left foot. Radiographic evaluation demonstrates swelling medial aspect of the PIPJ fifth left. Cutaneous evaluation also demonstrates breakdown of soft tissue between the toes.        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 Capsulitis PIPJ fifth digit left.  Plan: Injected PIPJ with dexamethasone and local anesthetic today debrided the reactive hyperkeratosis. Discussed the possible need for surgical intervention and he will followup with me as needed.

## 2014-01-31 ENCOUNTER — Ambulatory Visit (INDEPENDENT_AMBULATORY_CARE_PROVIDER_SITE_OTHER): Payer: BC Managed Care – PPO | Admitting: Podiatry

## 2014-01-31 VITALS — BP 128/89 | HR 80 | Resp 16

## 2014-01-31 DIAGNOSIS — M898X9 Other specified disorders of bone, unspecified site: Secondary | ICD-10-CM

## 2014-01-31 DIAGNOSIS — M7750 Other enthesopathy of unspecified foot: Secondary | ICD-10-CM

## 2014-01-31 DIAGNOSIS — L84 Corns and callosities: Secondary | ICD-10-CM

## 2014-01-31 DIAGNOSIS — M779 Enthesopathy, unspecified: Secondary | ICD-10-CM

## 2014-01-31 DIAGNOSIS — M778 Other enthesopathies, not elsewhere classified: Secondary | ICD-10-CM

## 2014-01-31 NOTE — Progress Notes (Signed)
He presents today complaining of a painful lesion between the fourth and fifth digits of the left foot. Same lesion that he had last time he was in.  Objective: Vital signs are stable he is alert and oriented x3 on physical examination he demonstrates pain on palpation and range of motion of the fifth metatarsophalangeal joint. He also has a soft tissue lesion in the sulcus between the fourth and fifth digits of the left foot. This is a heloma molle.  Assessment: Capsulitis fifth metatarsophalangeal joint left heloma molle fourth interdigital space left foot.  Plan: Discussed etiology pathology conservative versus surgical therapies. We injected dexam discussed the need for surgical intervention consisting of a wedding procedure and arthroplasty.ethasone and local anesthetic to the fifth metatarsophalangeal joint area. Debrided the reactive hyperkeratosis between the toes.

## 2014-04-19 ENCOUNTER — Ambulatory Visit: Payer: BC Managed Care – PPO | Admitting: Internal Medicine

## 2014-04-19 ENCOUNTER — Telehealth: Payer: Self-pay | Admitting: *Deleted

## 2014-04-19 DIAGNOSIS — Z0289 Encounter for other administrative examinations: Secondary | ICD-10-CM

## 2014-04-19 NOTE — Telephone Encounter (Signed)
Pt called to reschedule appointment 04/19/2014 at 1:15pm for 6 month follow up at 8:03am. Rescheduled for 04/26/2014 at 2:15pm

## 2014-04-26 ENCOUNTER — Telehealth: Payer: Self-pay | Admitting: Internal Medicine

## 2014-04-26 ENCOUNTER — Ambulatory Visit: Payer: BC Managed Care – PPO | Admitting: Internal Medicine

## 2014-04-26 NOTE — Telephone Encounter (Signed)
Pt was supposed to be seen on 04/19/2014 and today 04/26/2014, he has either cancelled or no showed both appts. According to last OV notes on 09/2013 he was supposed to return in 6 months, he will need to see Dr. Drue NovelPaz and speak with him about his Cialis refill since not only is he due for OV but also the medication is not on his list.

## 2014-04-26 NOTE — Telephone Encounter (Signed)
Pt is needing new rx for cialis, [facility], states dr. Drue NovelPaz written him an rx about 4 months ago, however it does not show in his med list.

## 2014-05-01 NOTE — Telephone Encounter (Signed)
Tried calling the pt mailbox is full could not leave a message.

## 2014-06-07 ENCOUNTER — Ambulatory Visit (HOSPITAL_BASED_OUTPATIENT_CLINIC_OR_DEPARTMENT_OTHER)
Admission: RE | Admit: 2014-06-07 | Discharge: 2014-06-07 | Disposition: A | Discharge status: Home / Self Care | Payer: BC Managed Care – PPO | Source: Ambulatory Visit | Attending: Internal Medicine | Admitting: Internal Medicine

## 2014-06-07 ENCOUNTER — Ambulatory Visit (INDEPENDENT_AMBULATORY_CARE_PROVIDER_SITE_OTHER): Payer: BC Managed Care – PPO | Admitting: Internal Medicine

## 2014-06-07 ENCOUNTER — Encounter: Payer: Self-pay | Admitting: Internal Medicine

## 2014-06-07 VITALS — BP 158/88 | HR 83 | Temp 98.5°F | Wt 241.4 lb

## 2014-06-07 DIAGNOSIS — R05 Cough: Secondary | ICD-10-CM | POA: Insufficient documentation

## 2014-06-07 DIAGNOSIS — G479 Sleep disorder, unspecified: Secondary | ICD-10-CM

## 2014-06-07 DIAGNOSIS — R030 Elevated blood-pressure reading, without diagnosis of hypertension: Secondary | ICD-10-CM

## 2014-06-07 DIAGNOSIS — R059 Cough, unspecified: Secondary | ICD-10-CM

## 2014-06-07 DIAGNOSIS — IMO0001 Reserved for inherently not codable concepts without codable children: Secondary | ICD-10-CM

## 2014-06-07 DIAGNOSIS — N529 Male erectile dysfunction, unspecified: Secondary | ICD-10-CM

## 2014-06-07 MED ORDER — TADALAFIL 20 MG PO TABS: 20.0000 mg | ORAL_TABLET | ORAL | Status: DC | PRN

## 2014-06-07 NOTE — Patient Instructions (Signed)
Stop by the first floor and get the XR   Check the  blood pressure   weekly  Be sure your blood pressure is between  145/85  and 110/65.  if it is consistently higher or lower, let me know   Please come back to the office in 4 to 6 months  for a physical exam. Come back fasting

## 2014-06-07 NOTE — Progress Notes (Signed)
Pre visit review using our clinic review tool, if applicable. No additional management support is needed unless otherwise documented below in the visit note. 

## 2014-06-07 NOTE — Progress Notes (Signed)
   Subjective:    Patient ID: Wesley Swanson, male    DOB: 11/24/1965, 48 y.o.   MRN: [id number]  DOS:  06/07/2014 Type of visit - description : follow-up Interval history: Elevated BP, has check his BP several times since the last visit and readings are always normal around 120/80. States that today's high may be because he took a " 5 hour energy drink". 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 Usually sleeps 3-4 hours a day after work  History of pneumonia, needs a chest x-ray    ROS Denies fever, chills. No cough, sputum production.   Past Medical History  Diagnosis Date  . ED (erectile dysfunction)     Past Surgical History  Procedure Laterality Date  . No past surgeries      History   Social History  . Marital Status: Married    Spouse Name: Natalia LeatherwoodKatherine     Number of Children: 0  . Years of Education: N/A   Occupational History  . 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 Smoking status: Current Every Day Smoker -- 1.00 packs/day for 18 years    Types: Cigarettes  . Smokeless tobacco: Never Used     Comment: 1 ppd  . Alcohol Use: 1.5 - 2.0 oz/week    3-4 drink(s) per week     Comment: occasionally, holidays  . Drug Use: No  . Sexual Activity: Not on file   Other Topics Concern  . Not on file   Social History Narrative        Medication List       This list is accurate as of: 06/07/14 11:59 PM.  Always use your most recent med list.               tadalafil 20 MG tablet  Commonly known as:  CIALIS  Take 1 tablet (20 mg total) by mouth every other day as needed for erectile dysfunction.           Objective:   Physical Exam BP 158/88 mmHg  Pulse 83  Temp(Src) 98.5 F (36.9 C) (Oral)  Wt 241 lb 6 oz (109.487 kg)  SpO2 98% General -- alert, well-developed, NAD.   Lungs -- normal respiratory effort, no intercostal retractions, no accessory muscle use,  and normal breath sounds.  Heart-- normal rate, regular rhythm, no murmur.  Extremities-- no pretibial edema bilaterally  Neurologic--  alert & oriented X3. Speech normal, gait appropriate for age, strength symmetric and appropriate for age.  Psych-- Cognition and judgment appear intact. Cooperative with normal attention span and concentration. No anxious or depressed appearing.     Assessment & Plan:

## 2014-06-08 DIAGNOSIS — N529 Male erectile dysfunction, unspecified: Secondary | ICD-10-CM | POA: Insufficient documentation

## 2014-06-08 DIAGNOSIS — G479 Sleep disorder, unspecified: Secondary | ICD-10-CM | POA: Insufficient documentation

## 2014-06-08 NOTE — Assessment & Plan Note (Signed)
Erectile dysfunction Request a refill on Cialis, has used before successfully. Intolerant to Viagra or levitra

## 2014-06-08 NOTE — Assessment & Plan Note (Signed)
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 Occasionally snores, once he sleeps his 4 or 5 hours daily he does gets occasionally sleepy. He may have a sleep apnea component, offered a referral to pulmonology but declined because he states that in general   feels well

## 2014-06-08 NOTE — Assessment & Plan Note (Signed)
Elevated BP, Ambulatory BPs within normal, BP today slightly elevated. Plan: Continue monitoring BPs

## 2014-06-08 NOTE — Assessment & Plan Note (Signed)
History of pneumonia Now asymptomatic, chest chest x-ray

## 2014-07-03 ENCOUNTER — Encounter (HOSPITAL_COMMUNITY): Payer: Self-pay | Admitting: Emergency Medicine

## 2014-07-03 ENCOUNTER — Emergency Department (HOSPITAL_COMMUNITY)
Admission: EM | Admit: 2014-07-03 | Discharge: 2014-07-03 | Disposition: A | Discharge status: Home / Self Care | Payer: BLUE CROSS/BLUE SHIELD | Source: Home / Self Care | Attending: Family Medicine | Admitting: Family Medicine

## 2014-07-03 DIAGNOSIS — T50995A Adverse effect of other drugs, medicaments and biological substances, initial encounter: Secondary | ICD-10-CM

## 2014-07-03 MED ORDER — PREDNISONE 5 MG PO KIT: PACK | ORAL | Status: DC

## 2014-07-03 NOTE — ED Provider Notes (Signed)
Wesley Swanson is a 49 y.o. male who presents to Urgent Care today for allergic reaction. Patient dyed his beard 3 days ago. 2 days ago he noted irritation in the beard area worsening today. Denies any trouble breathing or swallowing fevers or chills. He used a different hair dye than usual.   Past Medical History  Diagnosis Date  . ED (erectile dysfunction)    Past Surgical History  Procedure Laterality Date  . No past surgeries     History  Substance Use Topics  . Smoking status: Current Every Day Smoker -- 1.00 packs/day for 18 years    Types: Cigarettes  . Smokeless tobacco: Never Used     Comment: 1 ppd  . Alcohol Use: 1.5 - 2.0 oz/week    3-4 drink(s) per week     Comment: occasionally, holidays   ROS as above Medications: No current facility-administered medications for this encounter.   Current Outpatient Prescriptions  Medication Sig Dispense Refill  . PredniSONE 5 MG KIT 12 day dosepack po 1 kit 0  . tadalafil (CIALIS) 20 MG tablet Take 1 tablet (20 mg total) by mouth every other day as needed for erectile dysfunction. 6 tablet 2   No Known Allergies   Exam:  BP 164/95 mmHg  Pulse 72  Temp(Src) 98.3 F (36.8 C) (Oral)  Resp 18  SpO2 97% Gen: Well NAD Skin: Erythema and tenderness in the beard area. No oral swelling or tongue swelling.   No results found for this or any previous visit (from the past 24 hour(s)). No results found.  Assessment and Plan: 49 y.o. male with irritant/contact dermatitis. Treatment with prednisone dose pack. Follow-up with PCP.  Discussed warning signs or symptoms. Please see discharge instructions. Patient expresses understanding.     Gregor Hams, MD 07/03/14 [phone]

## 2014-07-03 NOTE — Discharge Instructions (Signed)
Thank you for coming in today. Take prednisone as directed Go to the emergency room if you get worse Contact Dermatitis Contact dermatitis is a reaction to certain substances that touch the skin. Contact dermatitis can be either irritant contact dermatitis or allergic contact dermatitis. Irritant contact dermatitis does not require previous exposure to the substance for a reaction to occur.Allergic contact dermatitis only occurs if you have been exposed to the substance before. Upon a repeat exposure, your body reacts to the substance.  CAUSES  Many substances can cause contact dermatitis. Irritant dermatitis is most commonly caused by repeated exposure to mildly irritating substances, such as:  Makeup.  Soaps.  Detergents.  Bleaches.  Acids.  Metal salts, such as nickel. Allergic contact dermatitis is most commonly caused by exposure to:  Poisonous plants.  Chemicals (deodorants, shampoos).  Jewelry.  Latex.  Neomycin in triple antibiotic cream.  Preservatives in products, including clothing. SYMPTOMS  The area of skin that is exposed may develop:  Dryness or flaking.  Redness.  Cracks.  Itching.  Pain or a burning sensation.  Blisters. With allergic contact dermatitis, there may also be swelling in areas such as the eyelids, mouth, or genitals.  DIAGNOSIS  Your caregiver can usually tell what the problem is by doing a physical exam. In cases where the cause is uncertain and an allergic contact dermatitis is suspected, a patch skin test may be performed to help determine the cause of your dermatitis. TREATMENT Treatment includes protecting the skin from further contact with the irritating substance by avoiding that substance if possible. Barrier creams, powders, and gloves may be helpful. Your caregiver may also recommend:  Steroid creams or ointments applied 2 times daily. For best results, soak the rash area in cool water for 20 minutes. Then apply the  medicine. Cover the area with a plastic wrap. You can store the steroid cream in the refrigerator for a "chilly" effect on your rash. That may decrease itching. Oral steroid medicines may be needed in more severe cases.  Antibiotics or antibacterial ointments if a skin infection is present.  Antihistamine lotion or an antihistamine taken by mouth to ease itching.  Lubricants to keep moisture in your skin.  Burow's solution to reduce redness and soreness or to dry a weeping rash. Mix one packet or tablet of solution in 2 cups cool water. Dip a clean washcloth in the mixture, wring it out a bit, and put it on the affected area. Leave the cloth in place for 30 minutes. Do this as often as possible throughout the day.  Taking several cornstarch or baking soda baths daily if the area is too large to cover with a washcloth. Harsh chemicals, such as alkalis or acids, can cause skin damage that is like a burn. You should flush your skin for 15 to 20 minutes with cold water after such an exposure. You should also seek immediate medical care after exposure. Bandages (dressings), antibiotics, and pain medicine may be needed for severely irritated skin.  HOME CARE INSTRUCTIONS  Avoid the substance that caused your reaction.  Keep the area of skin that is affected away from hot water, soap, sunlight, chemicals, acidic substances, or anything else that would irritate your skin.  Do not scratch the rash. Scratching may cause the rash to become infected.  You may take cool baths to help stop the itching.  Only take over-the-counter or prescription medicines as directed by your caregiver.  See your caregiver for follow-up care as directed to make  sure your skin is healing properly. SEEK MEDICAL CARE IF:   Your condition is not better after 3 days of treatment.  You seem to be getting worse.  You see signs of infection such as swelling, tenderness, redness, soreness, or warmth in the affected  area.  You have any problems related to your medicines. Document Released: 06/13/2000 Document Revised: 09/08/2011 Document Reviewed: 11/19/2010 [facility] Patient Information 2015 [place]. This information is not intended to replace advice given to you by your health care provider. Make sure you discuss any questions you have with your health care provider.

## 2014-07-10 ENCOUNTER — Ambulatory Visit: Payer: Self-pay | Admitting: Internal Medicine

## 2014-09-26 ENCOUNTER — Telehealth: Payer: Self-pay | Admitting: Internal Medicine

## 2014-09-26 MED ORDER — TADALAFIL 20 MG PO TABS: 20.0000 mg | ORAL_TABLET | ORAL | Status: DC | PRN

## 2014-09-26 NOTE — Telephone Encounter (Signed)
Caller name:Carreto, Casimiro NeedleMichael Relation to ZO:XWRUpt:self Call back number:[phone] Pharmacy:[facility]  Reason for call: pt is needing rx tadalafil (CIALIS) 20 MG tablet

## 2014-09-26 NOTE — Telephone Encounter (Signed)
Refills sent to [facility] as requested.

## 2014-09-26 NOTE — Telephone Encounter (Signed)
Pt is requesting refill on Cialis.   Last OV: 06/07/2014 Last Fill: 06/07/2014 #6 2RF  Okay to refill?

## 2014-09-26 NOTE — Telephone Encounter (Signed)
Ok #6, no RF

## 2014-12-18 ENCOUNTER — Telehealth: Payer: Self-pay | Admitting: Internal Medicine

## 2014-12-18 ENCOUNTER — Other Ambulatory Visit: Payer: Self-pay

## 2014-12-18 MED ORDER — TADALAFIL 20 MG PO TABS: 20.0000 mg | ORAL_TABLET | ORAL | Status: DC | PRN

## 2014-12-18 NOTE — Telephone Encounter (Signed)
Cialis refilled to [facility] as requested.

## 2014-12-18 NOTE — Telephone Encounter (Signed)
Relation to pt: self  Call back number: [phone] Pharmacy: [facility] [facility] - [place] - [address] [phone] (Phone) [phone] (Fax)         Reason for call:  Pt requesting a refill tadalafil (CIALIS) 20 MG tablet, pt requesting to pick up RX  today when he gets off work

## 2014-12-19 ENCOUNTER — Other Ambulatory Visit: Payer: Self-pay

## 2014-12-20 ENCOUNTER — Ambulatory Visit: Payer: BLUE CROSS/BLUE SHIELD | Admitting: Internal Medicine

## 2014-12-21 ENCOUNTER — Encounter: Payer: Self-pay | Admitting: Internal Medicine

## 2014-12-21 ENCOUNTER — Telehealth: Payer: Self-pay | Admitting: Internal Medicine

## 2014-12-21 NOTE — Telephone Encounter (Signed)
Pt was no show 12/20/14 2:00pm. patient left message on VM on 12/20/14 11:00am stating that he would not make it back into town for appt and needed to reschedule. mailbox full. Has not rescheduled. Charge?

## 2014-12-21 NOTE — Telephone Encounter (Signed)
No , is okay 

## 2015-01-29 ENCOUNTER — Telehealth: Payer: Self-pay | Admitting: Internal Medicine

## 2015-01-29 NOTE — Telephone Encounter (Signed)
Cialis was sent to pharmacy downstairs on 12/18/2014.

## 2015-01-29 NOTE — Telephone Encounter (Signed)
Caller name:Trantham, Chevon Relation to XB:JYNW Call back number:[phone] Pharmacy:[facility]  Reason for call: pt is needing tadalafil (CIALIS) 20 MG tablet, states dr. Drue Novel provides him with a voucher to use to get the medication

## 2015-03-05 ENCOUNTER — Ambulatory Visit (INDEPENDENT_AMBULATORY_CARE_PROVIDER_SITE_OTHER): Payer: BLUE CROSS/BLUE SHIELD | Admitting: Family Medicine

## 2015-03-05 VITALS — BP 134/82 | HR 88 | Temp 98.0°F | Resp 14 | Ht 71.5 in | Wt 245.0 lb

## 2015-03-05 DIAGNOSIS — K625 Hemorrhage of anus and rectum: Secondary | ICD-10-CM

## 2015-03-05 DIAGNOSIS — Z8371 Family history of colonic polyps: Secondary | ICD-10-CM

## 2015-03-05 LAB — POCT CBC
Granulocyte percent: 55.7 %G (ref 37–80)
HEMATOCRIT: 45.9 % (ref 43.5–53.7)
Hemoglobin: 14.1 g/dL (ref 14.1–18.1)
Lymph, poc: 2.8 (ref 0.6–3.4)
MCH, POC: 27.1 pg (ref 27–31.2)
MCHC: 30.6 g/dL — AB (ref 31.8–35.4)
MCV: 88.5 fL (ref 80–97)
MID (cbc): 0.8 (ref 0–0.9)
MPV: 7.5 fL (ref 0–99.8)
PLATELET COUNT, POC: 208 10*3/uL (ref 142–424)
POC GRANULOCYTE: 4.5 (ref 2–6.9)
POC LYMPH %: 35 % (ref 10–50)
POC MID %: 9.3 %M (ref 0–12)
RBC: 5.19 M/uL (ref 4.69–6.13)
RDW, POC: 14.4 %
WBC: 8.1 10*3/uL (ref 4.6–10.2)

## 2015-03-05 LAB — IFOBT (OCCULT BLOOD): IFOBT: NEGATIVE

## 2015-03-05 MED ORDER — HYDROCORTISONE ACE-PRAMOXINE 2.5-1 % RE CREA: 1.0000 "application " | TOPICAL_CREAM | Freq: Three times a day (TID) | RECTAL | Status: DC

## 2015-03-05 NOTE — Patient Instructions (Addendum)
This is suspicious for internal hemorrhoid bleeding. However cannot rule out something like a polyp up inside you.  Referral is being made to gastroenterology to have a colonoscopy some time in the near future  Use the Analpram cream as directed 2 or 3 times daily for the next week  Return if heavier bleeding at any time  If you're having to strain at your stools at all take some Colace stool softener or if you get constipated take MiraLAX which is a mild laxative.  Hemorrhoids Hemorrhoids are swollen veins around the rectum or anus. There are two types of hemorrhoids:   Internal hemorrhoids. These occur in the veins just inside the rectum. They may poke through to the outside and become irritated and painful.  External hemorrhoids. These occur in the veins outside the anus and can be felt as a painful swelling or hard lump near the anus. CAUSES  Pregnancy.   Obesity.   Constipation or diarrhea.   Straining to have a bowel movement.   Sitting for long periods on the toilet.  Heavy lifting or other activity that caused you to strain.  Anal intercourse. SYMPTOMS   Pain.   Anal itching or irritation.   Rectal bleeding.   Fecal leakage.   Anal swelling.   One or more lumps around the anus.  DIAGNOSIS  Your caregiver may be able to diagnose hemorrhoids by visual examination. Other examinations or tests that may be performed include:   Examination of the rectal area with a gloved hand (digital rectal exam).   Examination of anal canal using a small tube (scope).   A blood test if you have lost a significant amount of blood.  A test to look inside the colon (sigmoidoscopy or colonoscopy). TREATMENT Most hemorrhoids can be treated at home. However, if symptoms do not seem to be getting better or if you have a lot of rectal bleeding, your caregiver may perform a procedure to help make the hemorrhoids get smaller or remove them completely. Possible treatments  include:   Placing a rubber band at the base of the hemorrhoid to cut off the circulation (rubber band ligation).   Injecting a chemical to shrink the hemorrhoid (sclerotherapy).   Using a tool to burn the hemorrhoid (infrared light therapy).   Surgically removing the hemorrhoid (hemorrhoidectomy).   Stapling the hemorrhoid to block blood flow to the tissue (hemorrhoid stapling).  HOME CARE INSTRUCTIONS   Eat foods with fiber, such as whole grains, beans, nuts, fruits, and vegetables. Ask your doctor about taking products with added fiber in them (fibersupplements).  Increase fluid intake. Drink enough water and fluids to keep your urine clear or pale yellow.   Exercise regularly.   Go to the bathroom when you have the urge to have a bowel movement. Do not wait.   Avoid straining to have bowel movements.   Keep the anal area dry and clean. Use wet toilet paper or moist towelettes after a bowel movement.   Medicated creams and suppositories may be used or applied as directed.   Only take over-the-counter or prescription medicines as directed by your caregiver.   Take warm sitz baths for 15-20 minutes, 3-4 times a day to ease pain and discomfort.   Place ice packs on the hemorrhoids if they are tender and swollen. Using ice packs between sitz baths may be helpful.   Put ice in a plastic bag.   Place a towel between your skin and the bag.   Leave the  ice on for 15-20 minutes, 3-4 times a day.   Do not use a donut-shaped pillow or sit on the toilet for long periods. This increases blood pooling and pain.  SEEK MEDICAL CARE IF:  You have increasing pain and swelling that is not controlled by treatment or medicine.  You have uncontrolled bleeding.  You have difficulty or you are unable to have a bowel movement.  You have pain or inflammation outside the area of the hemorrhoids. MAKE SURE YOU:  Understand these instructions.  Will watch your  condition.  Will get help right away if you are not doing well or get worse. Document Released: 06/13/2000 Document Revised: 06/02/2012 Document Reviewed: 04/20/2012 [facility] Patient Information 2015 [place], [place]. This information is not intended to replace advice given to you by your health care provider. Make sure you discuss any questions you have with your health care provider.

## 2015-03-05 NOTE — Progress Notes (Signed)
Rectal bleeding Subjective:  Patient ID: Wesley Swanson, male    DOB: 01/14/1966  Age: 49 y.o. MRN: [id number]  49 year old man who has been having rectal bleeding intermittently the last few days. He has bowel movements without any blood, then he will have a BM that has a lot of blood with it turning the water red. There is a family history of colon polyps, but he has never had any rectal bleeding in the past and has never had a colonoscopy. He has gained a little weight, no abdominal pain or problems, no nausea or vomiting. Generally he is healthy. Not on any regular medicines. He drives a truck.   Objective:   No acute distress. Abdomen soft without mass or tenderness. Digital rectal exam normal. No bleeding noted.  Results for orders placed or performed in visit on 03/05/15  IFOBT POC (occult bld, rslt in office)  Result Value Ref Range   IFOBT Negative   POCT CBC  Result Value Ref Range   WBC 8.1 4.6 - 10.2 K/uL   Lymph, poc 2.8 0.6 - 3.4   POC LYMPH PERCENT 35.0 10 - 50 %L   MID (cbc) 0.8 0 - 0.9   POC MID % 9.3 0 - 12 %M   POC Granulocyte 4.5 2 - 6.9   Granulocyte percent 55.7 37 - 80 %G   RBC 5.19 4.69 - 6.13 M/uL   Hemoglobin 14.1 14.1 - 18.1 g/dL   HCT, POC 40.9 81.1 - 53.7 %   MCV 88.5 80 - 97 fL   MCH, POC 27.1 27 - 31.2 pg   MCHC 30.6 (A) 31.8 - 35.4 g/dL   RDW, POC 91.4 %   Platelet Count, POC 208 142 - 424 K/uL   MPV 7.5 0 - 99.8 fL     Assessment & Plan:   Assessment:  Intermittent bright red rectal bleeding  Plan:  Analpram Get a colonoscopy Return at anytime if worse  Patient Instructions  This is suspicious for internal hemorrhoid bleeding. However cannot rule out something like a polyp up inside you.  Referral is being made to gastroenterology to have a colonoscopy some time in the near future  Use the Analpram cream as directed 2 or 3 times daily for the next week  Return if heavier bleeding at any time  If you're having to strain at your  stools at all take some Colace stool softener or if you get constipated take MiraLAX which is a mild laxative.    HOPPER,DAVID, MD 03/05/2015

## 2015-06-01 ENCOUNTER — Telehealth: Payer: Self-pay | Admitting: Internal Medicine

## 2015-06-01 MED ORDER — TADALAFIL 20 MG PO TABS: 20.0000 mg | ORAL_TABLET | ORAL | Status: DC

## 2015-06-01 NOTE — Telephone Encounter (Signed)
Relation to ZO:XWRUpt:self Call back number: [phone]  Pharmacy: [facility] [facility] - [place] [address] [phone] (Phone) [phone] (Fax)         Reason for call:  Patient requesting a refill tadalafil (CIALIS) 20 MG tablet [[id number]][[id number]]

## 2015-06-01 NOTE — Telephone Encounter (Signed)
Patient scheduled for 06/15/2015

## 2015-06-01 NOTE — Telephone Encounter (Signed)
Okay Cialis 20 mg #6 no refills. Needs office visit

## 2015-06-01 NOTE — Telephone Encounter (Signed)
Cialis sent to [facility]. Pt overdue for routine follow-up. Please schedule at Pt's convenience. Thank you.

## 2015-06-01 NOTE — Telephone Encounter (Signed)
Pt is requesting refill on Cialis. No longer on medication list.  Last OV: 06/07/2014 Last Fill: 12/18/2014 #6 and 1RF   Okay to refill?

## 2015-06-15 ENCOUNTER — Telehealth: Payer: Self-pay | Admitting: Internal Medicine

## 2015-06-15 ENCOUNTER — Ambulatory Visit: Payer: BLUE CROSS/BLUE SHIELD | Admitting: Internal Medicine

## 2015-06-18 NOTE — Telephone Encounter (Signed)
Multiple no shows/cancellations. Charge.

## 2015-06-18 NOTE — Telephone Encounter (Signed)
Pt was no show 12/16 8:45am for follow up appt, LM for pt to call and reschedule, charge or no charge?

## 2015-09-10 ENCOUNTER — Telehealth: Payer: Self-pay | Admitting: Internal Medicine

## 2015-09-10 NOTE — Telephone Encounter (Signed)
LM for pt to call and schedule flu shot or update records and due for appt with Dr. Drue NovelPaz (last visit 05/2014)

## 2015-09-18 ENCOUNTER — Ambulatory Visit: Payer: BLUE CROSS/BLUE SHIELD | Admitting: Internal Medicine

## 2016-07-26 ENCOUNTER — Emergency Department (HOSPITAL_COMMUNITY)
Admission: EM | Admit: 2016-07-26 | Discharge: 2016-07-26 | Disposition: A | Discharge status: Home / Self Care | Payer: BLUE CROSS/BLUE SHIELD | Attending: Emergency Medicine | Admitting: Emergency Medicine

## 2016-07-26 ENCOUNTER — Emergency Department (HOSPITAL_COMMUNITY): Payer: BLUE CROSS/BLUE SHIELD

## 2016-07-26 ENCOUNTER — Encounter (HOSPITAL_COMMUNITY): Payer: Self-pay

## 2016-07-26 DIAGNOSIS — Y939 Activity, unspecified: Secondary | ICD-10-CM | POA: Diagnosis not present

## 2016-07-26 DIAGNOSIS — M25552 Pain in left hip: Secondary | ICD-10-CM | POA: Insufficient documentation

## 2016-07-26 DIAGNOSIS — Y92410 Unspecified street and highway as the place of occurrence of the external cause: Secondary | ICD-10-CM | POA: Insufficient documentation

## 2016-07-26 DIAGNOSIS — F17210 Nicotine dependence, cigarettes, uncomplicated: Secondary | ICD-10-CM | POA: Insufficient documentation

## 2016-07-26 DIAGNOSIS — Z79899 Other long term (current) drug therapy: Secondary | ICD-10-CM | POA: Insufficient documentation

## 2016-07-26 DIAGNOSIS — Y999 Unspecified external cause status: Secondary | ICD-10-CM | POA: Diagnosis not present

## 2016-07-26 DIAGNOSIS — S4992XA Unspecified injury of left shoulder and upper arm, initial encounter: Secondary | ICD-10-CM | POA: Diagnosis not present

## 2016-07-26 MED ORDER — IBUPROFEN 800 MG PO TABS
800.0000 mg | ORAL_TABLET | Freq: Once | ORAL | Status: AC
  Administered 2016-07-26: 800 mg via ORAL
  Filled 2016-07-26: qty 1

## 2016-07-26 MED ORDER — METHOCARBAMOL 500 MG PO TABS: 500.0000 mg | ORAL_TABLET | Freq: Two times a day (BID) | ORAL | 0 refills | Status: DC

## 2016-07-26 MED ORDER — IBUPROFEN 800 MG PO TABS: 800.0000 mg | ORAL_TABLET | Freq: Three times a day (TID) | ORAL | 0 refills | Status: DC
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
## 2016-07-29 ENCOUNTER — Emergency Department (HOSPITAL_COMMUNITY)
Admission: EM | Admit: 2016-07-29 | Discharge: 2016-07-29 | Disposition: A | Discharge status: Home / Self Care | Payer: BLUE CROSS/BLUE SHIELD | Attending: Dermatology | Admitting: Dermatology

## 2016-07-29 ENCOUNTER — Encounter (HOSPITAL_COMMUNITY): Payer: Self-pay | Admitting: Emergency Medicine

## 2016-07-29 DIAGNOSIS — Z5321 Procedure and treatment not carried out due to patient leaving prior to being seen by health care provider: Secondary | ICD-10-CM | POA: Insufficient documentation

## 2016-07-29 DIAGNOSIS — J111 Influenza due to unidentified influenza virus with other respiratory manifestations: Secondary | ICD-10-CM | POA: Diagnosis not present

## 2016-07-29 NOTE — ED Notes (Signed)
Called pt to be taken back to room. No response, unable to find pt at this time.

## 2016-07-29 NOTE — ED Notes (Signed)
Called pt to reassess and round 2x with no response. Unable to find pt in waiting room at this time.
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
## 2017-01-28 ENCOUNTER — Encounter (HOSPITAL_COMMUNITY): Payer: Self-pay | Admitting: Emergency Medicine

## 2017-01-28 ENCOUNTER — Emergency Department (HOSPITAL_COMMUNITY)
Admission: EM | Admit: 2017-01-28 | Discharge: 2017-01-28 | Disposition: A | Discharge status: Home / Self Care | Payer: Non-veteran care | Attending: Emergency Medicine | Admitting: Emergency Medicine

## 2017-01-28 DIAGNOSIS — K625 Hemorrhage of anus and rectum: Secondary | ICD-10-CM | POA: Diagnosis not present

## 2017-01-28 DIAGNOSIS — R112 Nausea with vomiting, unspecified: Secondary | ICD-10-CM | POA: Diagnosis not present

## 2017-01-28 DIAGNOSIS — F17210 Nicotine dependence, cigarettes, uncomplicated: Secondary | ICD-10-CM | POA: Insufficient documentation

## 2017-01-28 DIAGNOSIS — Z79899 Other long term (current) drug therapy: Secondary | ICD-10-CM | POA: Diagnosis not present

## 2017-01-28 DIAGNOSIS — R195 Other fecal abnormalities: Secondary | ICD-10-CM | POA: Diagnosis present

## 2017-01-28 LAB — COMPREHENSIVE METABOLIC PANEL
ALT: 62 U/L (ref 17–63)
AST: 48 U/L — ABNORMAL HIGH (ref 15–41)
Albumin: 3.9 g/dL (ref 3.5–5.0)
Alkaline Phosphatase: 120 U/L (ref 38–126)
Anion gap: 10 (ref 5–15)
BUN: 8 mg/dL (ref 6–20)
CHLORIDE: 103 mmol/L (ref 101–111)
CO2: 20 mmol/L — ABNORMAL LOW (ref 22–32)
Calcium: 8.6 mg/dL — ABNORMAL LOW (ref 8.9–10.3)
Creatinine, Ser: 1.4 mg/dL — ABNORMAL HIGH (ref 0.61–1.24)
GFR, EST NON AFRICAN AMERICAN: 58 mL/min — AB (ref >60)
Glucose, Bld: 163 mg/dL — ABNORMAL HIGH (ref 65–99)
POTASSIUM: 3.5 mmol/L (ref 3.5–5.1)
Sodium: 133 mmol/L — ABNORMAL LOW (ref 135–145)
Total Bilirubin: 0.7 mg/dL (ref 0.3–1.2)
Total Protein: 7 g/dL (ref 6.5–8.1)

## 2017-01-28 LAB — URINALYSIS, ROUTINE W REFLEX MICROSCOPIC
BACTERIA UA: NONE SEEN
Bilirubin Urine: NEGATIVE
GLUCOSE, UA: NEGATIVE mg/dL
Ketones, ur: NEGATIVE mg/dL
NITRITE: NEGATIVE
PROTEIN: 30 mg/dL — AB
Specific Gravity, Urine: 1.024 (ref 1.005–1.030)
pH: 5 (ref 5.0–8.0)

## 2017-01-28 LAB — ABO/RH: ABO/RH(D): O POS

## 2017-01-28 LAB — CBC
HEMATOCRIT: 43.3 % (ref 39.0–52.0)
Hemoglobin: 14.8 g/dL (ref 13.0–17.0)
MCH: 29.2 pg (ref 26.0–34.0)
MCHC: 34.2 g/dL (ref 30.0–36.0)
MCV: 85.4 fL (ref 78.0–100.0)
Platelets: 179 10*3/uL (ref 150–400)
RBC: 5.07 MIL/uL (ref 4.22–5.81)
RDW: 13.9 % (ref 11.5–15.5)
WBC: 8.4 10*3/uL (ref 4.0–10.5)

## 2017-01-28 LAB — TYPE AND SCREEN
ABO/RH(D): O POS
Antibody Screen: NEGATIVE

## 2017-01-28 LAB — POC OCCULT BLOOD, ED: FECAL OCCULT BLD: POSITIVE — AB

## 2017-01-28 LAB — LIPASE, BLOOD: Lipase: 24 U/L (ref 11–51)

## 2017-01-28 MED ORDER — PANTOPRAZOLE SODIUM 40 MG IV SOLR
40.0000 mg | Freq: Once | INTRAVENOUS | Status: AC
  Administered 2017-01-28: 40 mg via INTRAVENOUS
  Filled 2017-01-28: qty 40

## 2017-01-28 MED ORDER — OMEPRAZOLE 20 MG PO CPDR: 20.0000 mg | DELAYED_RELEASE_CAPSULE | Freq: Every day | ORAL | 0 refills | Status: DC

## 2017-01-28 MED ORDER — ONDANSETRON HCL 4 MG/2ML IJ SOLN
4.0000 mg | Freq: Once | INTRAMUSCULAR | Status: AC
  Administered 2017-01-28: 4 mg via INTRAVENOUS
  Filled 2017-01-28: qty 2

## 2017-01-28 MED ORDER — ONDANSETRON 4 MG PO TBDP: 4.0000 mg | ORAL_TABLET | Freq: Three times a day (TID) | ORAL | 0 refills | Status: DC | PRN

## 2017-01-28 MED ORDER — SODIUM CHLORIDE 0.9 % IV BOLUS (SEPSIS)
1000.0000 mL | Freq: Once | INTRAVENOUS | Status: AC
  Administered 2017-01-28: 1000 mL via INTRAVENOUS

## 2017-01-28 MED ORDER — CEPHALEXIN 500 MG PO CAPS: 500.0000 mg | ORAL_CAPSULE | Freq: Four times a day (QID) | ORAL | 0 refills | Status: DC

## 2017-01-28 NOTE — ED Triage Notes (Signed)
Pt presents with "not being able to keep anything down" x 1 month; pt reports worse some days than others; pt also states he has experienced bloody stools since 2 days ago; pt reports didn't have a BM but felt something in his rectum and went to the bathroom and it was all blood; pt reports hx GI bleed

## 2017-01-28 NOTE — ED Provider Notes (Signed)
[facility]-EMERGENCY DEPT Provider Note   CSN: [id number] Arrival date & time: 01/28/17  0541     History   Chief Complaint Chief Complaint  Patient presents with  . GI Bleeding    HPI Wesley Swanson is a 51 y.o. male with history of hypertension presents to the emergency department for evaluation of nausea, vomiting and abdominal pain  2 years and blood in stool since yesterday. Blood is both dark and bright. Abdominal pain is on left and right, non radiating, intermittent. Associated symptoms include decreased appetite and night sweats. No alleviating or aggravating factors. No previous abdominal surgeries. No history of PUD or heavy use of NSAIDs. No family history of colon cancer. Denies fevers, CP, SOB, urinary symptoms. He smokes one pack per day of cigarettes. Has a daily vodka drink. Admits to smoking "K2" marijuana for the past 6 years, daily use. Patient asked with marijuana use can be contributing to his symptoms.  HPI  Past Medical History:  Diagnosis Date  . ED (erectile dysfunction)     Patient Active Problem List   Diagnosis Date Noted  . Sleep disorder 06/08/2014  . Erectile dysfunction 06/08/2014  . Annual physical exam 10/18/2013  . Elevated BP 10/18/2013  . Cough, PNM CXR 10-07-13 09/19/2013    Past Surgical History:  Procedure Laterality Date  . NO PAST SURGERIES         Home Medications    Prior to Admission medications   Medication Sig Start Date End Date Taking? Authorizing Provider  cephALEXin (KEFLEX) 500 MG capsule Take 1 capsule (500 mg total) by mouth 4 (four) times daily. 01/28/17   Liberty Handy, PA-C  hydrocortisone-pramoxine (ANALPRAM HC) 2.5-1 % rectal cream Place 1 application rectally 3 (three) times daily. 03/05/15   Peyton Najjar, MD  ibuprofen (ADVIL,MOTRIN) 800 MG tablet Take 1 tablet (800 mg total) by mouth 3 (three) times daily. 07/26/16   Law, Waylan Boga, PA-C  methocarbamol (ROBAXIN) 500 MG tablet Take 1 tablet (500 mg total) by  mouth 2 (two) times daily. 07/26/16   Law, Waylan Boga, PA-C  omeprazole (PRILOSEC) 20 MG capsule Take 1 capsule (20 mg total) by mouth daily. 01/28/17 02/12/17  Liberty Handy, PA-C  ondansetron (ZOFRAN ODT) 4 MG disintegrating tablet Take 1 tablet (4 mg total) by mouth every 8 (eight) hours as needed for nausea or vomiting. 01/28/17   Liberty Handy, PA-C  tadalafil (CIALIS) 20 MG tablet Take 1 tablet (20 mg total) by mouth every other day. PRN 06/01/15   Wanda Plump, MD    Family History Family History  Problem Relation Age of Onset  . Hypertension Father   . Diabetes Father   . CAD Father        MI age 32  . Hypertension Brother   . Stroke Neg Hx   . Colon cancer Neg Hx   . Prostate cancer Neg Hx     Social History Social History  Substance Use Topics  . Smoking status: Current Every Day Smoker    Packs/day: 1.00    Years: 18.00    Types: Cigarettes  . Smokeless tobacco: Never Used     Comment: 1 ppd  . Alcohol use 1.5 - 2.0 oz/week    3 - 4 Standard drinks or equivalent per week     Comment: occasionally, holidays     Allergies   Penicillins   Review of Systems Review of Systems  Constitutional: Positive for appetite change and diaphoresis. Negative for  chills and fever.  HENT: Negative for congestion, rhinorrhea and sore throat.   Respiratory: Negative for cough and shortness of breath.   Cardiovascular: Negative for chest pain.  Gastrointestinal: Positive for abdominal pain and blood in stool. Negative for constipation, diarrhea, nausea, rectal pain and vomiting.  Genitourinary: Negative for difficulty urinating, dysuria and hematuria.  Musculoskeletal: Negative for back pain.  Skin: Negative for rash.  Neurological: Negative for headaches.     Physical Exam Updated Vital Signs BP (!) 164/84   Pulse 85   Temp 98.4 F (36.9 C) (Oral)   Resp (!) 22   Ht 6' (1.829 m)   Wt 120.2 kg (265 lb)   SpO2 100%   BMI 35.94 kg/m   Physical Exam    Constitutional: He is oriented to person, place, and time. He appears well-developed and well-nourished. No distress.  HENT:  Head: Normocephalic and atraumatic.  Nose: Nose normal.  Mouth/Throat: No oropharyngeal exudate.  Moist mucous membranes  Eyes: Pupils are equal, round, and reactive to light. Conjunctivae and EOM are normal.  No conjunctival pallor  Neck: Normal range of motion. Neck supple.  Cardiovascular: Normal rate, regular rhythm, normal heart sounds and intact distal pulses.   No murmur heard. Pulmonary/Chest: Effort normal and breath sounds normal. No respiratory distress. He has no wheezes. He has no rales.  Abdominal: Soft. Bowel sounds are normal. He exhibits no distension. There is no tenderness. There is no rebound and no guarding.  No suprapubic or CVA tenderness  Genitourinary: Rectal exam shows guaiac positive stool.  Genitourinary Comments:  Chaperone was present.  I was able to palpate the first 2-3cm of the rectum digitally without gross abnormalities or masses. Patient able to tolerate examination.  Patient without pain in perianal/rectal area with palpation.  There are no external fissures or external hemorrhoids noted.  No induration or swelling of the perianal skin.   Stool color is brown with no gross blood noted.   No signs of perirectal abscess.   DRE reveals good sphincter tone.    Musculoskeletal: Normal range of motion. He exhibits no deformity.  Lymphadenopathy:    He has no cervical adenopathy.  Neurological: He is alert and oriented to person, place, and time.  Skin: Skin is warm and dry. Capillary refill takes less than 2 seconds.  Psychiatric: He has a normal mood and affect. His behavior is normal. Judgment and thought content normal.  Nursing note and vitals reviewed.    ED Treatments / Results  Labs (all labs ordered are listed, but only abnormal results are displayed) Labs Reviewed  COMPREHENSIVE METABOLIC PANEL - Abnormal; Notable  for the following:       Result Value   Sodium 133 (*)    CO2 20 (*)    Glucose, Bld 163 (*)    Creatinine, Ser 1.40 (*)    Calcium 8.6 (*)    AST 48 (*)    GFR calc non Af Amer 58 (*)    All other components within normal limits  URINALYSIS, ROUTINE W REFLEX MICROSCOPIC - Abnormal; Notable for the following:    APPearance HAZY (*)    Hgb urine dipstick SMALL (*)    Protein, ur 30 (*)    Leukocytes, UA MODERATE (*)    Squamous Epithelial / LPF 0-5 (*)    All other components within normal limits  POC OCCULT BLOOD, ED - Abnormal; Notable for the following:    Fecal Occult Bld POSITIVE (*)    All other  components within normal limits  URINE CULTURE  CBC  LIPASE, BLOOD  RAPID URINE DRUG SCREEN, HOSP PERFORMED  TYPE AND SCREEN  ABO/RH    EKG  EKG Interpretation None       Radiology No results found.  Procedures Procedures (including critical care time)  Medications Ordered in ED Medications  ondansetron (ZOFRAN) injection 4 mg (4 mg Intravenous Given 01/28/17 0628)  sodium chloride 0.9 % bolus 1,000 mL (0 mLs Intravenous Stopped 01/28/17 0741)  pantoprazole (PROTONIX) injection 40 mg (40 mg Intravenous Given 01/28/17 0738)     Initial Impression / Assessment and Plan / ED Course  I have reviewed the triage vital signs and the nursing notes.  Pertinent labs & imaging results that were available during my care of the patient were reviewed by me and considered in my medical decision making (see chart for details).  Clinical Course as of Jan 28 909  Wed Jan 28, 2017  0711 Creatinine: (!) 1.40 [CG]  1610 Fecal Occult Blood, POC: (!) POSITIVE [CG]  9604 Leukocytes, UA: (!) MODERATE [CG]  0712 WBC, UA: TOO NUMEROUS TO COUNT [CG]  0712 Squamous Epithelial / LPF: (!) 0-5 [CG]  0747 Repeated abdominal examination unchanged from initial. Patient without nausea, vomiting, abdominal pain. He has tolerated crackers and soda in the ED.  [CG]  [id number] Contacted lab regarding delay  with lipase, lab did not receive label for lipase, they will add it now.   [CG]    Clinical Course User Index [CG] Liberty Handy, PA-C   51 year old male presents for evaluation of nausea, vomiting, abdominal pain for 2 years. He admits to heavy, daily use of marijuana and one daily vodka drink. He questions whether marijuana could be contributing to his symptoms. Lab work is reassuring.  Urinalysis looks dirty but without nitrites, he denies symptoms of UTI or urethritis. Will treat with keflex and send for culture. Patient was asymptomatic while in the ED, repeat abdominal exams all benign. He tolerated PO in the ED without complications. No fevers or constitutional symptoms. No previous abdominal surgeries. History, lab work and exam not consistent with pancreatitis, cholecystitis, appendicitis, diverticulitis or SBO. Doubt ACS. Possibly cannabinoid hyperemesis?  Pt also reports rectal bleeding x 1 day. Dark and bright red blood. No h/o PUD or heavy NSAID use, but drinks daily and smokes cigarettes. No fevers, diarrhea, constipation, unintentional weight loss.  No FMH of colon cancer. Has never had a colonoscopy. Hgb normal. HD stable. +hemoccult today, without evidence of fissures or hemorrhoids. Given reassuring physical exam, stable hgb, lack of other constitutional symptoms low suspicion for diverticulitis.  Patient will benefit from PPI, colonoscopy and GI f/u.   Final Clinical Impressions(s) / ED Diagnoses   Final diagnoses:  Nausea and vomiting in adult  Rectal bleeding   Pt considered safe for discharge with keflex for possible UTI, pending culture, and PPI and GI follow up. Strict ED return precautions given. Encouraged cessation of marijuana and ETOH use. He verbalized understanding and agreeable with plan.   Patient, ED treatment and discharge plan was discussed with supervising physician who is agreeable with plan.   New Prescriptions New Prescriptions   CEPHALEXIN (KEFLEX)  500 MG CAPSULE    Take 1 capsule (500 mg total) by mouth 4 (four) times daily.   OMEPRAZOLE (PRILOSEC) 20 MG CAPSULE    Take 1 capsule (20 mg total) by mouth daily.   ONDANSETRON (ZOFRAN ODT) 4 MG DISINTEGRATING TABLET    Take 1 tablet (4 mg  total) by mouth every 8 (eight) hours as needed for nausea or vomiting.     Liberty HandyGibbons, Cathryn Gallery J, PA-C 01/28/17 [id number]    Zadie RhineWickline, Donald, MD 01/29/17 [phone]

## 2017-01-28 NOTE — Discharge Instructions (Signed)
You were evaluated in the emergency department for nausea, vomiting, abdominal pain that has been going on for a couple of years. Your lab work is reassuring today without abnormalities. Your urine looked dirty but not necessarily infected. Given the appearance of your urine we will treat you for a urinary tract infection, and confirm with a urine culture which is still pending. You will be contacted if the urine culture grows bacteria.  You also reported rectal bleeding since yesterday. You do have microscopic blood in your rectum. This may be from many different causes including hemorrhoids, fissures, polyps and even cancer. We will treat you with omeprazole to protect the lining of your stomach, take this medication as prescribed. You need to contact a gastroenterologist as soon as possible to establish care, they will likely do a colonoscopy to determine the cause of your rectal bleeding. Use zofran for nausea as needed.  Smoking cigarettes, using marijuana and drinking large amounts of alcohol can predispose you to ulcers, abdominal pain, rectal bleeding. Stop smoking cigarettes, using marijuana and drinking daily.   Return to the emergency department for uncontrollable nausea, vomiting, abdominal pain, large amounts of rectal bleeding, lightheadedness, shortness of breath, chest pain, fevers.

## 2017-01-28 NOTE — ED Notes (Signed)
Got patient ready to go home took saline lock out

## 2017-01-29 LAB — URINE CULTURE: CULTURE: NO GROWTH

## 2018-01-28 LAB — HM COLONOSCOPY

## 2018-06-28 ENCOUNTER — Telehealth: Payer: Self-pay

## 2018-06-28 NOTE — Telephone Encounter (Signed)
Please call Pt- okay to schedule re-establish appt/NP appt at his convenience. Thank you.

## 2018-06-28 NOTE — Telephone Encounter (Signed)
Copied from CRM [id number]. Topic: Appointment Scheduling - Scheduling Inquiry for Clinic >> Jun 25, 2018 11:57 AM Herby AbrahamJohnson, Shiquita C wrote: Reason for CRM: pt's spouse Olegario MessierKathy called in to schedule ov with [facility] due to pt having back pain. [facility], it has been 3 years since last visit. Pt would like to know if provider would take him back on as a pt? Pt says that he just hadn't needed to come in to be seen so that's why its been so long.   Please advise / assist with scheduling.   CB:   [phone] Messier- Kathy

## 2018-06-28 NOTE — Telephone Encounter (Signed)
Please advise 

## 2018-06-28 NOTE — Telephone Encounter (Signed)
Okay to reestablish at his convenience

## 2018-06-29 NOTE — Telephone Encounter (Signed)
Appt scheduled 07/07/2018

## 2018-07-07 ENCOUNTER — Ambulatory Visit: Payer: BLUE CROSS/BLUE SHIELD | Admitting: Internal Medicine

## 2018-07-12 ENCOUNTER — Ambulatory Visit (INDEPENDENT_AMBULATORY_CARE_PROVIDER_SITE_OTHER): Payer: Managed Care, Other (non HMO) | Admitting: Internal Medicine

## 2018-07-12 ENCOUNTER — Encounter: Payer: Self-pay | Admitting: Internal Medicine

## 2018-07-12 ENCOUNTER — Ambulatory Visit (HOSPITAL_BASED_OUTPATIENT_CLINIC_OR_DEPARTMENT_OTHER)
Admission: RE | Admit: 2018-07-12 | Discharge: 2018-07-12 | Disposition: A | Discharge status: Home / Self Care | Payer: Managed Care, Other (non HMO) | Source: Ambulatory Visit | Attending: Internal Medicine | Admitting: Internal Medicine

## 2018-07-12 DIAGNOSIS — M549 Dorsalgia, unspecified: Secondary | ICD-10-CM | POA: Diagnosis present

## 2018-07-12 NOTE — Progress Notes (Signed)
Subjective:    Patient ID: Wesley Swanson, male    DOB: 07/17/1965, 53 y.o.   MRN: [id number]  DOS:  07/12/2018 Type of visit - description:  Ne patient, last visit 2015 His main concern is back pain. Symptoms started approximately 4 months ago. He reports that when he walks more than 30 yards, thelower back starts to "tight up", unclear if this sensation radiates but sometimes have pain on the whole left leg if he continue walking for longer . Once he stops walking, the pain goes away. Denies any major injuries however in the summer 2019 he slipped and fell from 1 step high and landed on his back.  Concerned about his weight, has gained a significant amount of weight in the last few years.  Wt Readings from Last 3 Encounters:  07/12/18 291 lb 4 oz (132.1 kg)  01/28/17 265 lb (120.2 kg)  07/29/16 260 lb (117.9 kg)    Review of Systems Denies chest pain, difficulty breathing or lower extremity edema.  No palpitations Denies classic claudication (no calf pain with walking) but L leg pain as described above is suspicious. No edema   Past Medical History:  Diagnosis Date  . ED (erectile dysfunction)     Past Surgical History:  Procedure Laterality Date  . NO PAST SURGERIES      Social History   Socioeconomic History  . Marital status: Married    Spouse name: Natalia LeatherwoodKatherine   . Number of children: 0  . Years of education: Not on file  . Highest education level: Not on file  Occupational History  . 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 Financial resource strain: Not on file  . Food insecurity:    Worry: Not on file    Inability: Not on file  . Transportation needs:    Medical: Not on file    Non-medical: Not on file  Tobacco Use  . Smoking status: Current Every Day Smoker    Packs/day: 1.00    Years: 18.00    Pack years: 18.00    Types: Cigarettes  . Smokeless tobacco: Never Used  . Tobacco comment: 1 ppd  Substance and Sexual Activity  . Alcohol use: Yes   Alcohol/week: 3.0 - 4.0 standard drinks    Types: 3 - 4 Standard drinks or equivalent per week    Comment: occasionally, holidays  . Drug use: No  . Sexual activity: Not on file  Lifestyle  . Physical activity:    Days per week: Not on file    Minutes per session: Not on file  . Stress: Not on file  Relationships  . Social connections:    Talks on phone: Not on file    Gets together: Not on file    Attends religious service: Not on file    Active member of club or organization: Not on file    Attends meetings of clubs or organizations: Not on file    Relationship status: Not on file  . Intimate partner violence:    Fear of current or ex partner: Not on file    Emotionally abused: Not on file    Physically abused: Not on file    Forced sexual activity: Not on file  Other Topics Concern  . Not on file  Social History Narrative   Household : pt and wife     Family History  Problem Relation Age of Onset  . Hypertension Father   . Diabetes Father   . CAD Father  19       MI age 85  . Hypertension Brother   . Stroke Neg Hx   . Colon cancer Neg Hx   . Prostate cancer Neg Hx     Allergies as of 07/12/2018      Reactions   Penicillins Rash      Medication List       Accurate as of July 12, 2018 11:59 PM. Always use your most recent med list.        amLODipine 10 MG tablet Commonly known as:  NORVASC Take 10 mg by mouth daily.   doxazosin 4 MG tablet Commonly known as:  CARDURA Take 4 mg by mouth daily.           Objective:   Physical Exam BP 136/80 (BP Location: Left Arm, Patient Position: Sitting, Cuff Size: Normal)   Pulse 88   Temp 98 F (36.7 C) (Oral)   Resp 16   Ht 5' 11.5" (1.816 m)   Wt 291 lb 4 oz (132.1 kg)   SpO2 98%   BMI 40.05 kg/m  General:   Well developed, NAD, BMI noted.  HEENT:  Normocephalic . Face symmetric, atraumatic Lungs:  CTA B Normal respiratory effort, no intercostal retractions, no accessory muscle use. Heart:  RRR,  no murmur.  no pretibial edema bilaterally Vascular exam: Normal carotid, femoral and pedal pulses Abdomen:  Not distended, soft, non-tender. No rebound or rigidity.  No bruit Skin: Not pale. Not jaundice Neurologic:  alert & oriented X3.  Speech normal, gait appropriate for age and unassisted Back: No TTP DTRs: Symmetrically reduced Psych--  Cognition and judgment appear intact.  Cooperative with normal attention span and concentration.  Behavior appropriate. No anxious or depressed appearing.     Assessment     ASSESSMENT: Sleep disorder?  Declined a referral 2015 Smoker  Morbid obesity   PLAN: Back pain: As described above, normal vascular exam, suspect pain could be from spinal stenosis or spine DJD.  Will get x-ray and refer to Ortho. Morbid obesity: Patient has gained a significant amount of weight, I am concerned about his weight, the fact he smokes and his family history of CAD. We will get a CMP, CBC, TSH, A1c and FLP (fast for ~ 8 hours)n.  Further advised with results. Also recommend to think about the wellness clinic RTC CPX 3 months

## 2018-07-12 NOTE — Patient Instructions (Addendum)
GO TO THE LAB : Get the blood work     GO TO THE FRONT DESK Schedule your next appointment for a physical exam in 3 months    STOP BY THE FIRST FLOOR:  get the XR   Think about going to the wellness clinic  GENERAL INFORMATION ABOUT HEALTHY EATING, CHOLESTEROL,  QUIT TOBACCO (if you smoke):  The American Heart Association, www.heart.org  Check the "Life's Simple 7" from the [facility] The American diabetes Association www.diabetes.org  "[facility] A to Z Health Guide" book

## 2018-07-12 NOTE — Progress Notes (Signed)
Pre visit review using our clinic review tool, if applicable. No additional management support is needed unless otherwise documented below in the visit note. 

## 2018-07-13 DIAGNOSIS — Z09 Encounter for follow-up examination after completed treatment for conditions other than malignant neoplasm: Secondary | ICD-10-CM | POA: Insufficient documentation

## 2018-07-13 LAB — LDL CHOLESTEROL, DIRECT: Direct LDL: 125 mg/dL

## 2018-07-13 LAB — CBC WITH DIFFERENTIAL/PLATELET
Basophils Absolute: 0.1 10*3/uL (ref 0.0–0.1)
Basophils Relative: 1.1 % (ref 0.0–3.0)
Eosinophils Absolute: 0.1 10*3/uL (ref 0.0–0.7)
Eosinophils Relative: 2.2 % (ref 0.0–5.0)
HCT: 45 % (ref 39.0–52.0)
Hemoglobin: 14.9 g/dL (ref 13.0–17.0)
LYMPHS ABS: 2.4 10*3/uL (ref 0.7–4.0)
Lymphocytes Relative: 37.6 % (ref 12.0–46.0)
MCHC: 33.2 g/dL (ref 30.0–36.0)
MCV: 89 fl (ref 78.0–100.0)
Monocytes Absolute: 0.7 10*3/uL (ref 0.1–1.0)
Monocytes Relative: 11.7 % (ref 3.0–12.0)
Neutro Abs: 3 10*3/uL (ref 1.4–7.7)
Neutrophils Relative %: 47.4 % (ref 43.0–77.0)
Platelets: 178 10*3/uL (ref 150.0–400.0)
RBC: 5.05 Mil/uL (ref 4.22–5.81)
RDW: 14 % (ref 11.5–15.5)
WBC: 6.3 10*3/uL (ref 4.0–10.5)

## 2018-07-13 LAB — COMPREHENSIVE METABOLIC PANEL
ALT: 50 U/L (ref 0–53)
AST: 32 U/L (ref 0–37)
Albumin: 4.3 g/dL (ref 3.5–5.2)
Alkaline Phosphatase: 119 U/L — ABNORMAL HIGH (ref 39–117)
BILIRUBIN TOTAL: 0.4 mg/dL (ref 0.2–1.2)
BUN: 12 mg/dL (ref 6–23)
CO2: 25 mEq/L (ref 19–32)
Calcium: 9 mg/dL (ref 8.4–10.5)
Chloride: 102 mEq/L (ref 96–112)
Creatinine, Ser: 1.22 mg/dL (ref 0.40–1.50)
GFR: 80.56 mL/min (ref >60.00)
Glucose, Bld: 89 mg/dL (ref 70–99)
Potassium: 3.8 mEq/L (ref 3.5–5.1)
Sodium: 137 mEq/L (ref 135–145)
Total Protein: 7.4 g/dL (ref 6.0–8.3)

## 2018-07-13 LAB — LIPID PANEL
Cholesterol: 201 mg/dL — ABNORMAL HIGH (ref 0–200)
HDL: 46.3 mg/dL (ref >39.00)
NONHDL: 154.96
Total CHOL/HDL Ratio: 4
Triglycerides: 204 mg/dL — ABNORMAL HIGH (ref 0.0–149.0)
VLDL: 40.8 mg/dL — ABNORMAL HIGH (ref 0.0–40.0)

## 2018-07-13 LAB — TSH: TSH: 4.06 u[IU]/mL (ref 0.35–4.50)

## 2018-07-13 LAB — HEMOGLOBIN A1C: Hgb A1c MFr Bld: 7.1 % — ABNORMAL HIGH (ref 4.6–6.5)

## 2018-07-13 NOTE — Assessment & Plan Note (Signed)
Back pain: As described above, normal vascular exam, suspect pain could be from spinal stenosis or spine DJD.  Will get x-ray and refer to Ortho. Morbid obesity: Patient has gained a significant amount of weight, I am concerned about his weight, the fact he smokes and his family history of CAD. We will get a CMP, CBC, TSH, A1c and FLP (fast for ~ 8 hours)n.  Further advised with results. Also recommend to think about the wellness clinic RTC CPX 3 months

## 2018-07-27 ENCOUNTER — Ambulatory Visit (INDEPENDENT_AMBULATORY_CARE_PROVIDER_SITE_OTHER): Payer: Managed Care, Other (non HMO) | Admitting: Orthopaedic Surgery

## 2018-07-27 ENCOUNTER — Encounter (INDEPENDENT_AMBULATORY_CARE_PROVIDER_SITE_OTHER): Payer: Self-pay | Admitting: Orthopaedic Surgery

## 2018-07-27 VITALS — BP 148/98 | HR 81 | Ht 73.0 in | Wt 285.0 lb

## 2018-07-27 DIAGNOSIS — M545 Low back pain, unspecified: Secondary | ICD-10-CM

## 2018-07-27 DIAGNOSIS — G8929 Other chronic pain: Secondary | ICD-10-CM
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
## 2018-09-07 ENCOUNTER — Other Ambulatory Visit (HOSPITAL_BASED_OUTPATIENT_CLINIC_OR_DEPARTMENT_OTHER): Payer: Self-pay

## 2018-09-07 ENCOUNTER — Ambulatory Visit (INDEPENDENT_AMBULATORY_CARE_PROVIDER_SITE_OTHER): Payer: Managed Care, Other (non HMO) | Admitting: Orthopaedic Surgery

## 2018-09-07 DIAGNOSIS — G4733 Obstructive sleep apnea (adult) (pediatric): Secondary | ICD-10-CM

## 2018-10-08 ENCOUNTER — Encounter: Payer: BLUE CROSS/BLUE SHIELD | Admitting: Internal Medicine

## 2018-10-21 ENCOUNTER — Encounter (HOSPITAL_BASED_OUTPATIENT_CLINIC_OR_DEPARTMENT_OTHER): Payer: Managed Care, Other (non HMO)

## 2018-12-02 ENCOUNTER — Encounter (HOSPITAL_BASED_OUTPATIENT_CLINIC_OR_DEPARTMENT_OTHER): Payer: Managed Care, Other (non HMO)

## 2018-12-14 ENCOUNTER — Ambulatory Visit (INDEPENDENT_AMBULATORY_CARE_PROVIDER_SITE_OTHER): Payer: No Typology Code available for payment source | Admitting: Internal Medicine

## 2018-12-14 ENCOUNTER — Encounter: Payer: Self-pay | Admitting: Internal Medicine

## 2018-12-14 ENCOUNTER — Other Ambulatory Visit: Payer: Self-pay

## 2018-12-14 VITALS — BP 130/81 | HR 86 | Temp 98.3°F | Resp 16 | Ht 73.0 in | Wt 274.1 lb

## 2018-12-14 DIAGNOSIS — E119 Type 2 diabetes mellitus without complications: Secondary | ICD-10-CM | POA: Insufficient documentation

## 2018-12-14 DIAGNOSIS — R7989 Other specified abnormal findings of blood chemistry: Secondary | ICD-10-CM | POA: Diagnosis not present

## 2018-12-14 DIAGNOSIS — Z0000 Encounter for general adult medical examination without abnormal findings: Secondary | ICD-10-CM | POA: Diagnosis not present

## 2018-12-14 LAB — HEMOGLOBIN A1C: Hgb A1c MFr Bld: 6.3 % (ref 4.6–6.5)

## 2018-12-14 LAB — T4, FREE: Free T4: 0.79 ng/dL (ref 0.60–1.60)

## 2018-12-14 LAB — TSH: TSH: 2.85 u[IU]/mL (ref 0.35–4.50)

## 2018-12-14 LAB — T3, FREE: T3, Free: 3.7 pg/mL (ref 2.3–4.2)

## 2018-12-14 MED ORDER — TADALAFIL 10 MG PO TABS: 10.0000 mg | ORAL_TABLET | Freq: Every day | ORAL | 3 refills | Status: DC | PRN

## 2018-12-14 NOTE — Progress Notes (Signed)
Subjective:    Patient ID: Wesley Swanson, male    DOB: 07/23/1965, 53 y.o.   MRN: [id number]  DOS:  12/14/2018 Type of visit - description:  CPX No major concerns. Since the last time, he was diagnosed with diabetes, he has changed his diet and has lost per his own scales 21 pounds. He also had back pain, that seems better, patient thinks is better due to weight loss  Wt Readings from Last 3 Encounters:  12/14/18 274 lb 2 oz (124.3 kg)  07/27/18 285 lb (129.3 kg)  07/12/18 291 lb 4 oz (132.1 kg)    Review of Systems Good COVID-19 precautions.  Denies fever chills No cough No chest pain no difficulty breathing. From time to time his urinary flow is a slow but no other LUTS  Other than above, a 14 point review of systems is negative    Past Medical History:  Diagnosis Date  . ED (erectile dysfunction)     Past Surgical History:  Procedure Laterality Date  . NO PAST SURGERIES      Social History   Socioeconomic History  . Marital status: Married    Spouse name: Natalia LeatherwoodKatherine   . Number of children: 0  . Years of education: Not on file  . Highest education level: Not on file  Occupational History  . 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 Financial resource strain: Not on file  . Food insecurity    Worry: Not on file    Inability: Not on file  . Transportation needs    Medical: Not on file    Non-medical: Not on file  Tobacco Use  . Smoking status: Current Every Day Smoker    Packs/day: 1.00    Years: 18.00    Pack years: 18.00    Types: Cigarettes  . Smokeless tobacco: Never Used  . Tobacco comment: 1 ppd  Substance and Sexual Activity  . Alcohol use: Yes    Alcohol/week: 3.0 - 4.0 standard drinks    Types: 3 - 4 Standard drinks or equivalent per week    Comment: occasionally, holidays  . Drug use: No  . Sexual activity: Not on file  Lifestyle  . Physical activity    Days per week: Not on file    Minutes per session: Not on file  . Stress: Not on  file  Relationships  . Social Musicianconnections    Talks on phone: Not on file    Gets together: Not on file    Attends religious service: Not on file    Active member of club or organization: Not on file    Attends meetings of clubs or organizations: Not on file    Relationship status: Not on file  . Intimate partner violence    Fear of current or ex partner: Not on file    Emotionally abused: Not on file    Physically abused: Not on file    Forced sexual activity: Not on file  Other Topics Concern  . Not on file  Social History Narrative   Household : pt and wife      Allergies as of 12/14/2018      Reactions   Penicillins Rash      Medication List       Accurate as of December 14, 2018  8:02 PM. If you have any questions, ask your nurse or doctor.        amLODipine 10 MG tablet Commonly known as: NORVASC Take 10 mg  by mouth daily.   doxazosin 4 MG tablet Commonly known as: CARDURA Take 4 mg by mouth daily.   tadalafil 10 MG tablet Commonly known as: Cialis Take 1 tablet (10 mg total) by mouth daily as needed for erectile dysfunction. Started by: Kathlene November, MD      Family History  Problem Relation Age of Onset  . Hypertension Father   . Diabetes Father   . CAD Father 38       MI age 33  . Hypertension Brother   . Stroke Neg Hx   . Colon cancer Neg Hx   . Prostate cancer Neg Hx          Objective:   Physical Exam BP 130/81 (BP Location: Right Arm, Patient Position: Sitting, Cuff Size: Normal)   Pulse 86   Temp 98.3 F (36.8 C) (Oral)   Resp 16   Ht 6\' 1"  (1.854 m)   Wt 274 lb 2 oz (124.3 kg)   SpO2 100%   BMI 36.17 kg/m  General:   Well developed, NAD, BMI noted.  HEENT:  Normocephalic . Face symmetric, atraumatic Neck no thyromegaly Lungs:  CTA B Normal respiratory effort, no intercostal retractions, no accessory muscle use. Heart: RRR,  no murmur.  no pretibial edema bilaterally  Abdomen:  Not distended, soft, non-tender. No rebound or  rigidity.   Skin: Not pale. Not jaundice Neurologic:  alert & oriented X3.  Speech normal, gait appropriate for age and unassisted Psych--  Cognition and judgment appear intact.  Cooperative with normal attention span and concentration.  Behavior appropriate. No anxious or depressed appearing.     Assessment    ASSESSMENT: DM dx 06-2018 A1C 7.1 Hyperlipidemia  Sleep disorder?  Declined a referral 2015 Smoker  Morbid obesity   PLAN: DM: New diagnosis with A1c of 7.1 few months ago, he is doing great with diet, has eliminated sodas, fried foods, eating more vegetables.  Checking A1c. Hyperlipidemia: Diet controlled, with a new diagnosis of diabetes the LDL goal is now 100.  Checking lipid panel, he is not fasting today. Sleep disorder?  To have a sleep study soon. Smoker: he  admits he is not ready to quit. RTC 4 months

## 2018-12-14 NOTE — Patient Instructions (Addendum)
Get the blood work     [facility] Schedule your next appointment  For routine visit in 4 months   Get a flu shot early this year

## 2018-12-14 NOTE — Assessment & Plan Note (Addendum)
Gets regular care at the [facility]: Reports a colonoscopy around 8 months ago  at the [facility], per patient, no documentation Prostate cancer screening: Reportedly done at the [facility]: Doing great, has lost a significant about of weight.  Plans to go back to exercise once the gym opens (currently closed d/t Covid) Labs: Will check lipid profile, he is not fasting.  Also A1c and TFTs, last TSH was a slightly elevated.

## 2018-12-14 NOTE — Assessment & Plan Note (Signed)
DM: New diagnosis with A1c of 7.1 few months ago, he is doing great with diet, has eliminated sodas, fried foods, eating more vegetables.  Checking A1c. Hyperlipidemia: Diet controlled, with a new diagnosis of diabetes the LDL goal is now 100.  Checking lipid panel, he is not fasting today. Sleep disorder?  To have a sleep study soon. Smoker: he  admits he is not ready to quit. RTC 4 months

## 2018-12-14 NOTE — Progress Notes (Signed)
Pre visit review using our clinic review tool, if applicable. No additional management support is needed unless otherwise documented below in the visit note. 

## 2018-12-15 LAB — LIPID PANEL
Cholesterol: 180 mg/dL (ref 0–200)
HDL: 45 mg/dL (ref >39.00)
LDL Cholesterol: 115 mg/dL — ABNORMAL HIGH (ref 0–99)
NonHDL: 134.95
Total CHOL/HDL Ratio: 4
Triglycerides: 101 mg/dL (ref 0.0–149.0)
VLDL: 20.2 mg/dL (ref 0.0–40.0)

## 2018-12-24 ENCOUNTER — Other Ambulatory Visit (HOSPITAL_COMMUNITY)
Admission: RE | Admit: 2018-12-24 | Discharge: 2018-12-24 | Disposition: A | Discharge status: Home / Self Care | Payer: Managed Care, Other (non HMO) | Source: Ambulatory Visit | Attending: Internal Medicine | Admitting: Internal Medicine

## 2018-12-24 DIAGNOSIS — Z1159 Encounter for screening for other viral diseases: Secondary | ICD-10-CM | POA: Diagnosis not present

## 2018-12-24 LAB — SARS CORONAVIRUS 2 (TAT 6-24 HRS): SARS Coronavirus 2: NEGATIVE

## 2018-12-27 ENCOUNTER — Ambulatory Visit (HOSPITAL_BASED_OUTPATIENT_CLINIC_OR_DEPARTMENT_OTHER): Payer: No Typology Code available for payment source | Attending: Family | Admitting: Internal Medicine

## 2018-12-27 ENCOUNTER — Other Ambulatory Visit: Payer: Self-pay

## 2018-12-27 DIAGNOSIS — G4733 Obstructive sleep apnea (adult) (pediatric): Secondary | ICD-10-CM | POA: Insufficient documentation

## 2019-01-08 DIAGNOSIS — G4733 Obstructive sleep apnea (adult) (pediatric): Secondary | ICD-10-CM | POA: Diagnosis not present

## 2019-01-08 NOTE — Procedures (Signed)
Patient Name: Wesley Swanson, Wesley Swanson Date: 12/27/2018 Gender: Male D.O.B: Jun 20, 1966 Age (years): 66 Referring Provider: Alessandra Grout FNP Height (inches): 32 Interpreting Physician: Baird Lyons MD, ABSM Weight (lbs): 272 RPSGT: Carolin Coy BMI: 37 MRN: [id number] Neck Size: 19.50  CLINICAL INFORMATION Sleep Study Type: Split Night CPAP Indication for sleep study: Excessive Daytime Sleepiness, Fatigue, OSA, Snoring Epworth Sleepiness Score: 10  SLEEP STUDY TECHNIQUE As per the AASM Manual for the Scoring of Sleep and Associated Events v2.3 (April 2016) with a hypopnea requiring 4% desaturations.  The channels recorded and monitored were frontal, central and occipital EEG, electrooculogram (EOG), submentalis EMG (chin), nasal and oral airflow, thoracic and abdominal wall motion, anterior tibialis EMG, snore microphone, electrocardiogram, and pulse oximetry. Continuous positive airway pressure (CPAP) was initiated when the patient met split night criteria and was titrated according to treat sleep-disordered breathing.  MEDICATIONS Medications self-administered by patient taken the night of the study : none reported  RESPIRATORY PARAMETERS Diagnostic  Total AHI (/hr): 17.3 RDI (/hr): 44.1 OA Index (/hr): - CA Index (/hr): 0.0 REM AHI (/hr): 65.1 NREM AHI (/hr): 10.0 Supine AHI (/hr): 33.4 Non-supine AHI (/hr): 4.1 Min O2 Sat (%): 88.0 Mean O2 (%): 94.0 Time below 88% (min): 0.1   Titration  Optimal Pressure (cm): 12 AHI at Optimal Pressure (/hr): 0.0 Min O2 at Optimal Pressure (%): 96.0 Supine % at Optimal (%): 0 Sleep % at Optimal (%): 86   SLEEP ARCHITECTURE The recording time for the entire night was 369 minutes.  During a baseline period of 195.0 minutes, the patient slept for 132.0 minutes in REM and nonREM, yielding a sleep efficiency of 67.7%%. Sleep onset after lights out was 27.4 minutes with a REM latency of 56.0 minutes. The patient spent 42.4%% of the night  in stage N1 sleep, 44.3%% in stage N2 sleep, 0.0%% in stage N3 and 13.3% in REM.  During the titration period of 169.1 minutes, the patient slept for 126.0 minutes in REM and nonREM, yielding a sleep efficiency of 74.5%%. Sleep onset after CPAP initiation was 15.0 minutes with a REM latency of 41.0 minutes. The patient spent 37.3%% of the night in stage N1 sleep, 43.7%% in stage N2 sleep, 0.0%% in stage N3 and 19% in REM.  CARDIAC DATA The 2 lead EKG demonstrated sinus rhythm. The mean heart rate was 100.0 beats per minute. Other EKG findings include: None.  LEG MOVEMENT DATA The total Periodic Limb Movements of Sleep (PLMS) were 0. The PLMS index was 0.0 .  IMPRESSIONS - Moderate obstructive sleep apnea occurred during the diagnostic portion of the study(AHI = 17.3/hour). An optimal PAP pressure was selected for this patient ( 12 cm of water) - No significant central sleep apnea occurred during the diagnostic portion of the study (CAI = 0.0/hour). - The patient had mild oxygen desaturation during the diagnostic portion of the study (Min O2 = 88.0%) - The patient snored with moderate snoring volume during the diagnostic portion of the study. - No cardiac abnormalities were noted during this study. - Clinically significant periodic limb movements did not occur during sleep.  DIAGNOSIS - Obstructive Sleep Apnea (327.23 [G47.33 ICD-10])  RECOMMENDATIONS - Trial of CPAP therapy on 12 cm H2O or autopap 5-15. - Patient used a Large size Fisher&Paykel Full Face Mask Simplus mask and heated humidification. - Be careful with alcohol, sedatives and other CNS depressants that may worsen sleep apnea and disrupt normal sleep architecture. - Sleep hygiene should be reviewed to assess factors  that may improve sleep quality. - Weight management and regular exercise should be initiated or continued.  [Electronically signed] 01/08/2019 12:03 PM  Baird Lyons MD, [place], American Board of Sleep  Medicine   NPI: [id number]                         [place], [facility] [facility]  ELECTRONICALLY SIGNED ON:  01/08/2019, 12:01 PM [place] PH: [phone]   FX: [phone] [place]

## 2019-04-15 ENCOUNTER — Ambulatory Visit (INDEPENDENT_AMBULATORY_CARE_PROVIDER_SITE_OTHER): Payer: No Typology Code available for payment source | Admitting: Internal Medicine

## 2019-04-15 ENCOUNTER — Other Ambulatory Visit: Payer: Self-pay

## 2019-04-15 ENCOUNTER — Encounter: Payer: Self-pay | Admitting: Internal Medicine

## 2019-04-15 DIAGNOSIS — G4733 Obstructive sleep apnea (adult) (pediatric): Secondary | ICD-10-CM | POA: Diagnosis not present

## 2019-04-15 DIAGNOSIS — N529 Male erectile dysfunction, unspecified: Secondary | ICD-10-CM

## 2019-04-15 DIAGNOSIS — I10 Essential (primary) hypertension: Secondary | ICD-10-CM

## 2019-04-15 MED ORDER — TADALAFIL 10 MG PO TABS: 10.0000 mg | ORAL_TABLET | Freq: Every day | ORAL | 3 refills | Status: DC | PRN

## 2019-04-15 MED FILL — TADALAFIL 10 MG TABS: 10 | 6 days supply | Qty: 6 | Fill #0

## 2019-04-15 NOTE — Progress Notes (Signed)
Subjective:    Patient ID: Wesley Swanson, male    DOB: 09-19-1965, 53 y.o.   MRN: [id number]  DOS:  04/15/2019 Type of visit - description: Attempted  to make this a video visit, due to technical difficulties from the patient side it was not possible  thus we proceeded with a Virtual Visit via Telephone    I connected with@   by telephone and verified that I am speaking with the correct person using two identifiers.  THIS ENCOUNTER IS A VIRTUAL VISIT DUE TO COVID-19 - PATIENT WAS NOT SEEN IN THE OFFICE. PATIENT HAS CONSENTED TO VIRTUAL VISIT / TELEMEDICINE VISIT   Location of patient: home  Location of provider: office  I discussed the limitations, risks, security and privacy concerns of performing an evaluation and management service by telephone and the availability of in person appointments. I also discussed with the patient that there may be a patient responsible charge related to this service. The patient expressed understanding and agreed to proceed.   History of Present Illness: Follow-up Reports feels extremely fatigue, wonders what can be done about it.  On chart review, recent sleep study shows sleep apnea. ED: Cialis is very expensive, alternative?Marland Kitchen HTN: Reports good compliance with medication, ambulatory BPs normal per patient    Review of Systems Denies chest pain no difficulty breathing No nausea, vomiting, diarrhea Past Medical History:  Diagnosis Date  . ED (erectile dysfunction)     Past Surgical History:  Procedure Laterality Date  . NO PAST SURGERIES      Social History   Socioeconomic History  . Marital status: Married    Spouse name: Natalia Leatherwood   . Number of children: 0  . Years of education: Not on file  . Highest education level: Not on file  Occupational History  . Occupation: Truck Hospital doctor-  Social Needs  . Financial resource strain: Not on file  . Food insecurity    Worry: Not on file    Inability: Not on file  . Transportation needs   Medical: Not on file    Non-medical: Not on file  Tobacco Use  . Smoking status: Current Every Day Smoker    Packs/day: 1.00    Years: 18.00    Pack years: 18.00    Types: Cigarettes  . Smokeless tobacco: Never Used  . Tobacco comment: 1 ppd  Substance and Sexual Activity  . Alcohol use: Yes    Alcohol/week: 3.0 - 4.0 standard drinks    Types: 3 - 4 Standard drinks or equivalent per week    Comment: occasionally, holidays  . Drug use: No  . Sexual activity: Not on file  Lifestyle  . Physical activity    Days per week: Not on file    Minutes per session: Not on file  . Stress: Not on file  Relationships  . Social Musician on phone: Not on file    Gets together: Not on file    Attends religious service: Not on file    Active member of club or organization: Not on file    Attends meetings of clubs or organizations: Not on file    Relationship status: Not on file  . Intimate partner violence    Fear of current or ex partner: Not on file    Emotionally abused: Not on file    Physically abused: Not on file    Forced sexual activity: Not on file  Other Topics Concern  . Not on file  Social History Narrative   Household : pt and wife      Allergies as of 04/15/2019      Reactions   Penicillins Rash      Medication List       Accurate as of April 15, 2019 12:07 PM. If you have any questions, ask your nurse or doctor.        amLODipine 10 MG tablet Commonly known as: NORVASC Take 10 mg by mouth daily.   doxazosin 4 MG tablet Commonly known as: CARDURA Take 4 mg by mouth daily.   tadalafil 10 MG tablet Commonly known as: Cialis Take 1 tablet (10 mg total) by mouth daily as needed for erectile dysfunction.           Objective:   Physical Exam There were no vitals taken for this visit. This is a virtual phone visit.  Alert oriented x3, he does not sound in distress.  Cooperative, coherent    Assessment     ASSESSMENT: DM dx 06-2018 A1C  7.1 Hyperlipidemia  Sleep apnea, + sleep test 11-2018 Smoker  Morbid obesity   PLAN: Fatigue: See next OSA: Few months ago a sleep study (  RX by the [facility]?) was + for OSA , the patient never got the results. Refer to pulmonary.  If fatigue is not resolved after he start using a CPAP will need further evaluation. HTN: Reports good compliance with medication and normal ambulatory BPs ED: Requests Cialis sent to our pharmacy, apparently cost is better there.  Declined a generic Viagra. RTC 4 months   I discussed the assessment and treatment plan with the patient. The patient was provided an opportunity to ask questions and all were answered. The patient agreed with the plan and demonstrated an understanding of the instructions.   The patient was advised to call back or seek an in-person evaluation if the symptoms worsen or if the condition fails to improve as anticipated.  I provided 18 minutes of non-face-to-face time during this encounter.  Kathlene November, MD

## 2019-04-17 NOTE — Assessment & Plan Note (Signed)
Fatigue: See next OSA: Few months ago a sleep study (  RX by the [facility]?) was + for OSA , the patient never got the results. Refer to pulmonary.  If fatigue is not resolved after he start using a CPAP will need further evaluation. HTN: Reports good compliance with medication and normal ambulatory BPs ED: Requests Cialis sent to our pharmacy, apparently cost is better there.  Declined a generic Viagra. RTC 4 months

## 2019-09-30 ENCOUNTER — Ambulatory Visit (HOSPITAL_COMMUNITY)
Admission: EM | Admit: 2019-09-30 | Discharge: 2019-09-30 | Disposition: A | Discharge status: Home / Self Care | Payer: No Typology Code available for payment source | Attending: Family Medicine | Admitting: Family Medicine

## 2019-09-30 ENCOUNTER — Other Ambulatory Visit: Payer: Self-pay

## 2019-09-30 ENCOUNTER — Encounter (HOSPITAL_COMMUNITY): Payer: Self-pay

## 2019-09-30 DIAGNOSIS — R42 Dizziness and giddiness: Secondary | ICD-10-CM

## 2019-09-30 MED ORDER — MECLIZINE HCL 25 MG PO TABS: 25.0000 mg | ORAL_TABLET | Freq: Three times a day (TID) | ORAL | 0 refills | Status: DC | PRN

## 2019-09-30 NOTE — ED Provider Notes (Signed)
[facility]    CSN: [id number] Arrival date & time: 09/30/19  1556      History   Chief Complaint Chief Complaint  Patient presents with  . Dizziness    HPI Wesley Swanson is a 54 y.o. male.   Patient reports dizziness since this morning.  He states it feels like the room is spinning.  He states that it is the worst when he lies down.  Has not made any attempts to treat this at home.  Has never experienced vertigo or dizziness like this before.  Per chart review, patient has history significant for obstructive sleep apnea, diabetes, hypertension and erectile dysfunction.  Patient denies new foods, new medications, new drinks, other new exposures.  Declines headache, nausea, vomiting, diarrhea, sore throat, shortness of breath chills, body aches, rash, fever, other symptoms.  ROS per HPI  The history is provided by the patient.    Past Medical History:  Diagnosis Date  . ED (erectile dysfunction)     Patient Active Problem List   Diagnosis Date Noted  . OSA (obstructive sleep apnea) 12/27/2018  . Diabetes mellitus without complication (HCC) 12/14/2018  . PCP NOTES >>>>>>>>>>>> 07/13/2018  . Morbid obesity (HCC) 07/13/2018  . Sleep disorder 06/08/2014  . Erectile dysfunction 06/08/2014  . Annual physical exam 10/18/2013  . Hypertension 10/18/2013  . Cough, PNM CXR 10-07-13 09/19/2013    Past Surgical History:  Procedure Laterality Date  . NO PAST SURGERIES         Home Medications    Prior to Admission medications   Medication Sig Start Date End Date Taking? Authorizing Provider  amLODipine (NORVASC) 10 MG tablet Take 10 mg by mouth daily.    [provider]  doxazosin (CARDURA) 4 MG tablet Take 4 mg by mouth daily.    [provider]  meclizine (ANTIVERT) 25 MG tablet Take 1 tablet (25 mg total) by mouth 3 (three) times daily as needed for dizziness. 09/30/19   Moshe Cipro, NP  tadalafil (CIALIS) 10 MG tablet Take 1 tablet  (10 mg total) by mouth daily as needed for erectile dysfunction. 04/15/19   Wanda Plump, MD    Family History Family History  Problem Relation Age of Onset  . Hypertension Father   . Diabetes Father   . CAD Father 36       MI age 52  . Hypertension Brother   . Stroke Neg Hx   . Colon cancer Neg Hx   . Prostate cancer Neg Hx     Social History Social History   Tobacco Use  . Smoking status: Current Every Day Smoker    Packs/day: 1.00    Years: 18.00    Pack years: 18.00    Types: Cigarettes  . Smokeless tobacco: Never Used  . Tobacco comment: 1 ppd  Substance Use Topics  . Alcohol use: Yes    Alcohol/week: 3.0 - 4.0 standard drinks    Types: 3 - 4 Standard drinks or equivalent per week    Comment: occasionally, holidays  . Drug use: No     Allergies   Penicillins   Review of Systems Review of Systems   Physical Exam Triage Vital Signs ED Triage Vitals  Enc Vitals Group     BP 09/30/19 1621 136/84     Pulse Rate 09/30/19 1621 89     Resp 09/30/19 1621 20     Temp 09/30/19 1621 98 F (36.7 C)     Temp Source 09/30/19  1621 Oral     SpO2 09/30/19 1621 95 %     Weight --      Height --      Head Circumference --      Peak Flow --      Pain Score 09/30/19 1620 0     Pain Loc --      Pain Edu? --      Excl. in GC? --    No data found.  Updated Vital Signs BP 136/84 (BP Location: Right Arm)   Pulse 89   Temp 98 F (36.7 C) (Oral)   Resp 20   SpO2 95%   Visual Acuity Right Eye Distance:   Left Eye Distance:   Bilateral Distance:    Right Eye Near:   Left Eye Near:    Bilateral Near:     Physical Exam Vitals and nursing note reviewed.  Constitutional:      General: He is not in acute distress.    Appearance: Normal appearance. He is well-developed. He is not ill-appearing.  HENT:     Head: Normocephalic and atraumatic.     Right Ear: Tympanic membrane normal.     Left Ear: Tympanic membrane normal.     Nose: Nose normal.      Mouth/Throat:     Mouth: Mucous membranes are moist.     Pharynx: Oropharynx is clear.  Eyes:     Extraocular Movements: Extraocular movements intact.     Conjunctiva/sclera: Conjunctivae normal.     Pupils: Pupils are equal, round, and reactive to light.  Cardiovascular:     Rate and Rhythm: Normal rate and regular rhythm.     Heart sounds: Normal heart sounds. No murmur.  Pulmonary:     Effort: Pulmonary effort is normal. No respiratory distress.     Breath sounds: Normal breath sounds.  Abdominal:     General: Abdomen is flat. Bowel sounds are normal. There is no distension.     Palpations: Abdomen is soft. There is no mass.     Tenderness: There is no abdominal tenderness. There is no right CVA tenderness, left CVA tenderness, guarding or rebound.     Hernia: No hernia is present.  Musculoskeletal:        General: Normal range of motion.     Cervical back: Normal range of motion and neck supple.  Skin:    General: Skin is warm and dry.     Capillary Refill: Capillary refill takes less than 2 seconds.  Neurological:     General: No focal deficit present.     Mental Status: He is alert and oriented to person, place, and time.  Psychiatric:        Mood and Affect: Mood normal.        Behavior: Behavior normal.        Thought Content: Thought content normal.      UC Treatments / Results  Labs (all labs ordered are listed, but only abnormal results are displayed) Labs Reviewed - No data to display  EKG   Radiology No results found.  Procedures Procedures (including critical care time)  Medications Ordered in UC Medications - No data to display  Initial Impression / Assessment and Plan / UC Course  I have reviewed the triage vital signs and the nursing notes.  Pertinent labs & imaging results that were available during my care of the patient were reviewed by me and considered in my medical decision making (see chart for details).  Presents with dizziness  since this morning.  Likely vertigo.  No concern for orthostatic hypotension as patient's dizziness worsens when he lies down and gets better when he stands up.  Prescribed meclizine 25 mg 3 times daily as needed dizziness.  If this is not helping, patient is instructed to follow-up with this office or with primary care on Monday.  Instructed to go to the ER for trouble breathing, trouble swallowing, other concerning symptoms. Final Clinical Impressions(s) / UC Diagnoses   Final diagnoses:  Vertigo     Discharge Instructions     You are likely experiencing vertigo. I have sent in meclizine 25mg . You may take this 3 times a day as needed for dizziness.   If  you are having worsening symptoms, follow up with this office or with primary care as needed.     ED Prescriptions    Medication Sig Dispense Auth. Provider   meclizine (ANTIVERT) 25 MG tablet Take 1 tablet (25 mg total) by mouth 3 (three) times daily as needed for dizziness. 30 tablet Faustino Congress, NP     PDMP not reviewed this encounter.   Faustino Congress, NP 09/30/19 1642

## 2019-09-30 NOTE — Discharge Instructions (Addendum)
You are likely experiencing vertigo. I have sent in meclizine 25mg . You may take this 3 times a day as needed for dizziness.   If  you are having worsening symptoms, follow up with this office or with primary care as needed.

## 2019-09-30 NOTE — ED Triage Notes (Signed)
Pt states since this morning the room is spinning if he lay down or stand up.

## 2019-10-11 DIAGNOSIS — H8112 Benign paroxysmal vertigo, left ear: Secondary | ICD-10-CM | POA: Insufficient documentation

## 2019-10-11 DIAGNOSIS — H903 Sensorineural hearing loss, bilateral: Secondary | ICD-10-CM | POA: Insufficient documentation

## 2019-11-14 ENCOUNTER — Institutional Professional Consult (permissible substitution): Payer: No Typology Code available for payment source | Admitting: Pulmonary Disease

## 2020-09-06 ENCOUNTER — Encounter: Payer: Self-pay | Admitting: Internal Medicine

## 2020-10-04 IMAGING — CR DG LUMBAR SPINE COMPLETE 4+V
6 series · 6 of 6 positions shown · non-contrast
Comparison: None.

CLINICAL DATA: Low back pain.  Remote history of fall.

EXAM:
LUMBAR SPINE - COMPLETE 4+ VIEW

[t l-spine a.p.]
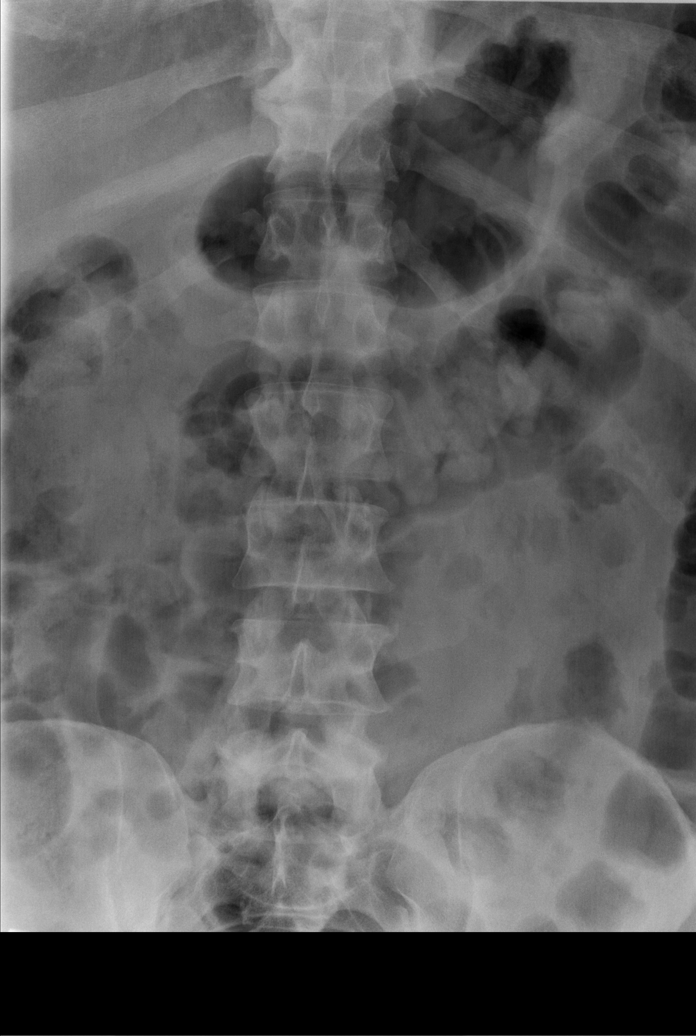

[t l-spine oblique exposure (1 of 2)]
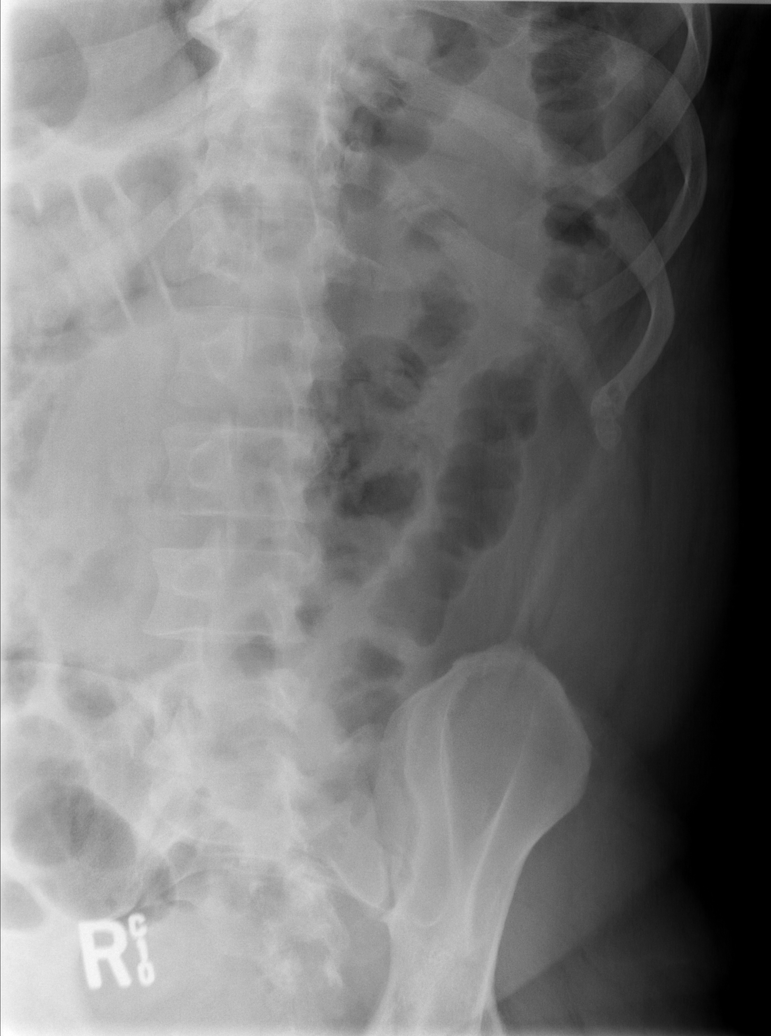

[t l-spine oblique exposure (2 of 2)]
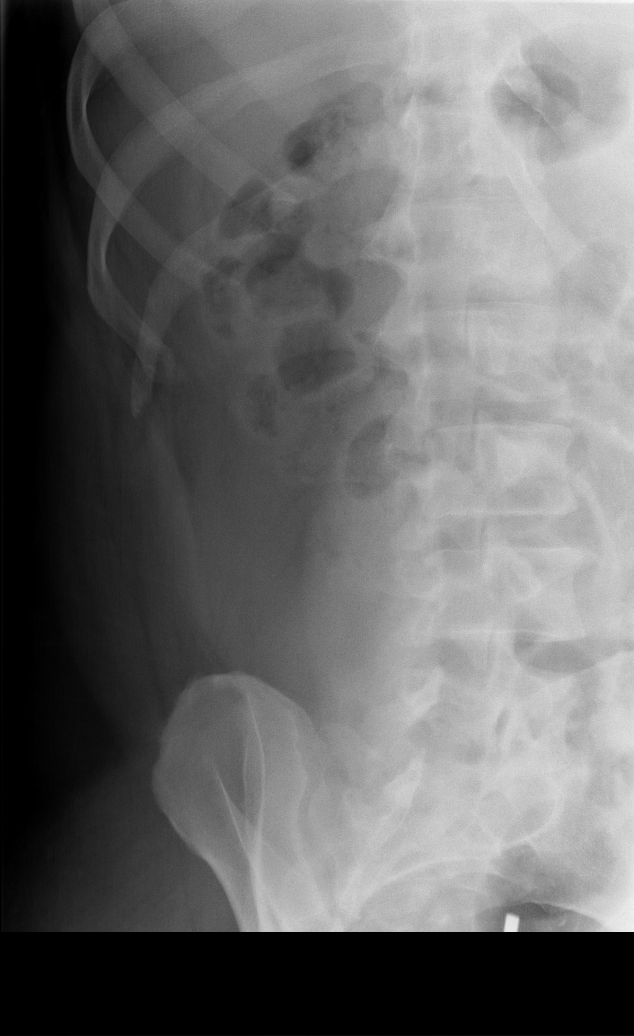

[t l-spine oblique exposure *]
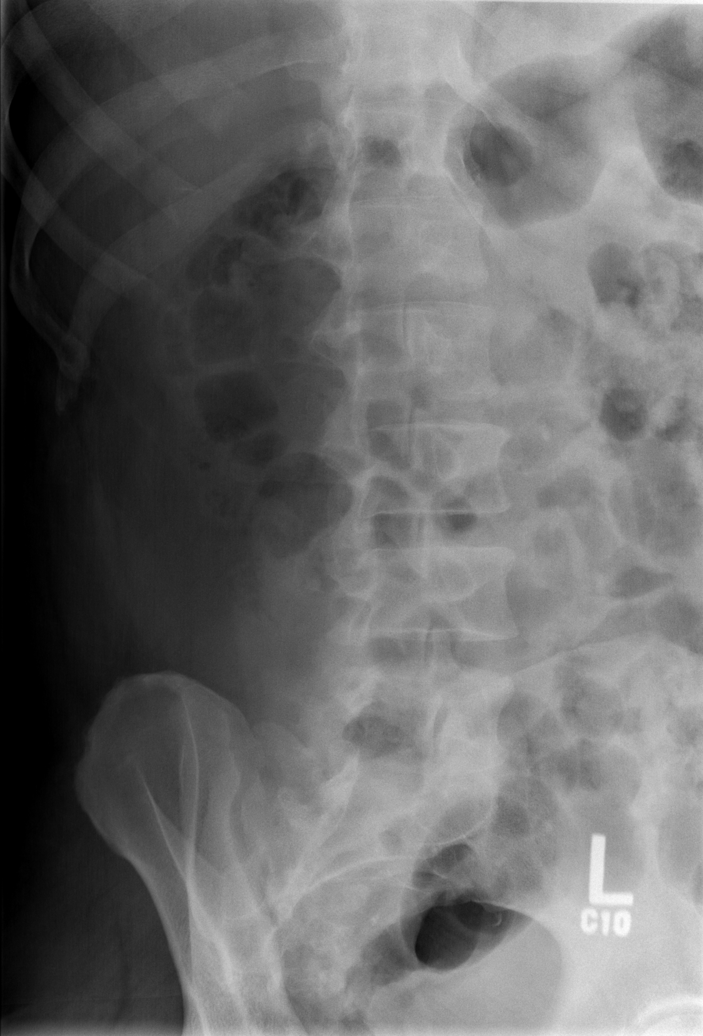

[t l-spine lat]
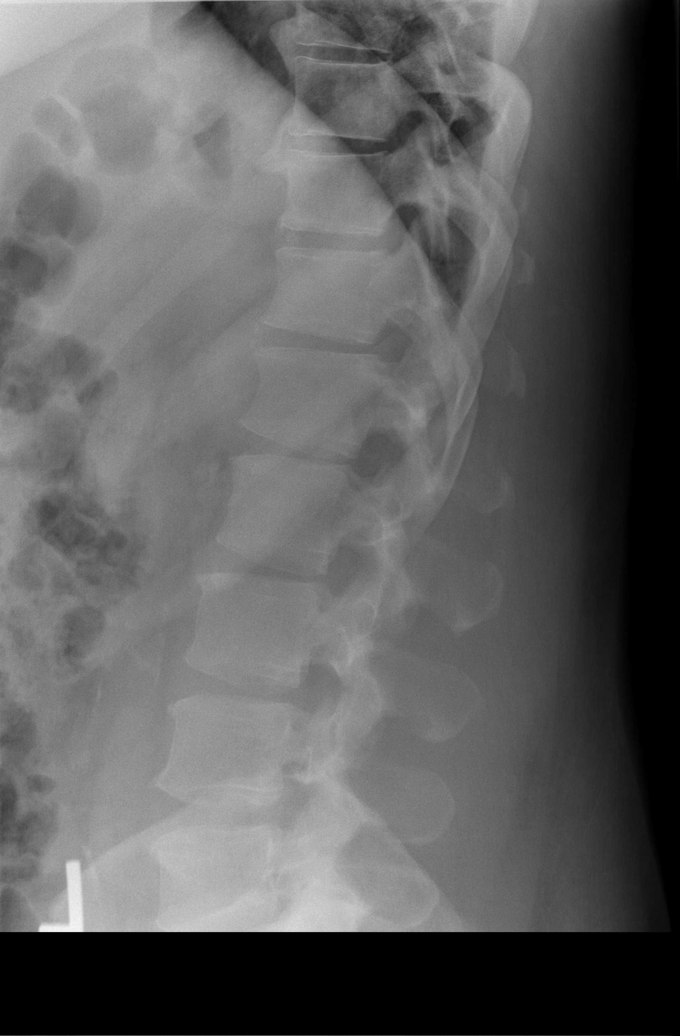

[t l-spine l5-s1 spot]
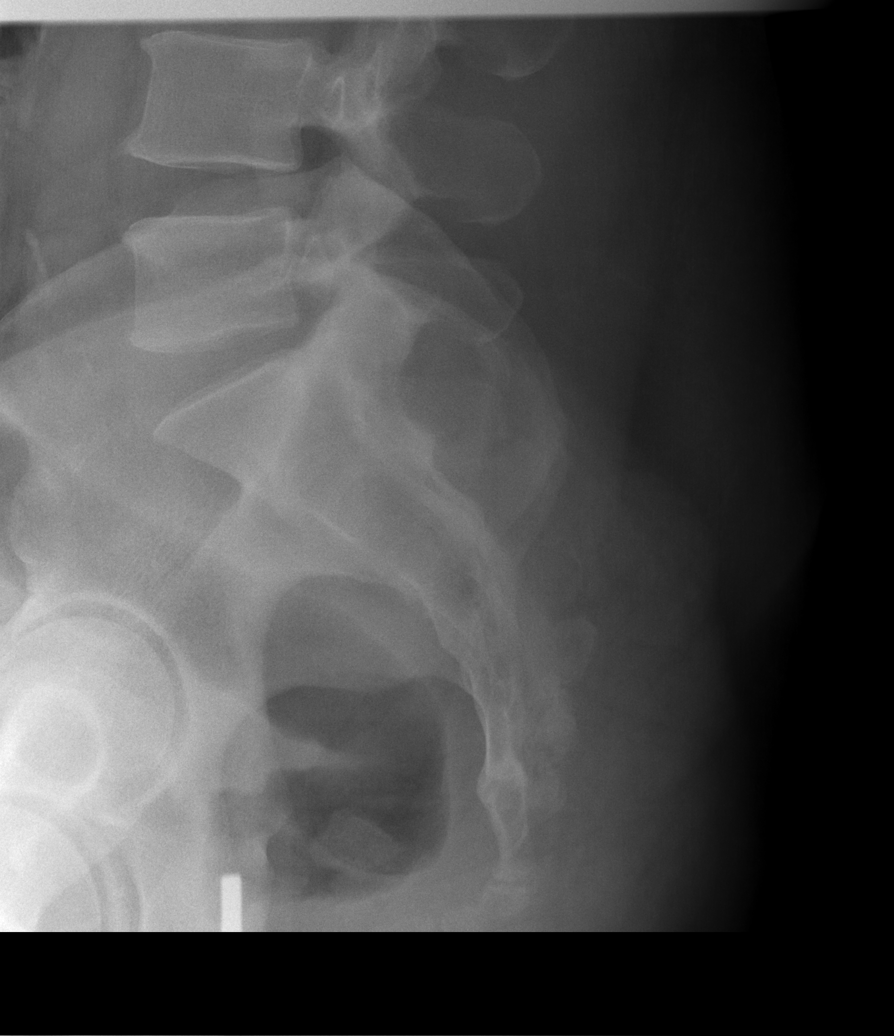

[6 of 6 positions shown; findings below may reference images not displayed]

FINDINGS: There is no evidence of lumbar spine fracture. Alignment is normal.
Intervertebral disc spaces are maintained.
IMPRESSION: Negative.

## 2020-11-07 LAB — CBC AND DIFFERENTIAL
HCT: 44 (ref 41–53)
Hemoglobin: 14.9 (ref 13.5–17.5)
Platelets: 190 10*3/uL (ref 150–400)

## 2020-11-07 LAB — LIPID PANEL
Cholesterol: 200 (ref 0–200)
HDL: 41 (ref 35–70)
LDL Cholesterol: 118
Triglycerides: 206 — AB (ref 40–160)

## 2020-11-07 LAB — VITAMIN B12: Vitamin B-12: 693

## 2020-11-07 LAB — MICROALBUMIN, URINE: Microalb, Ur: 2.98

## 2020-12-23 ENCOUNTER — Encounter (HOSPITAL_COMMUNITY): Payer: Self-pay

## 2020-12-23 ENCOUNTER — Ambulatory Visit (HOSPITAL_COMMUNITY)
Admission: EM | Admit: 2020-12-23 | Discharge: 2020-12-23 | Disposition: A | Discharge status: Home / Self Care | Payer: No Typology Code available for payment source | Attending: Family Medicine | Admitting: Family Medicine

## 2020-12-23 ENCOUNTER — Other Ambulatory Visit: Payer: Self-pay

## 2020-12-23 DIAGNOSIS — L03311 Cellulitis of abdominal wall: Secondary | ICD-10-CM

## 2020-12-23 DIAGNOSIS — R109 Unspecified abdominal pain: Secondary | ICD-10-CM | POA: Diagnosis present

## 2020-12-23 DIAGNOSIS — R10A3 Flank pain, bilateral: Secondary | ICD-10-CM

## 2020-12-23 DIAGNOSIS — R300 Dysuria: Secondary | ICD-10-CM | POA: Diagnosis not present

## 2020-12-23 LAB — POCT URINALYSIS DIPSTICK, ED / UC
Bilirubin Urine: NEGATIVE
Glucose, UA: 250 mg/dL — AB
Ketones, ur: NEGATIVE mg/dL
Nitrite: NEGATIVE
Protein, ur: NEGATIVE mg/dL
Specific Gravity, Urine: 1.025 (ref 1.005–1.030)
Urobilinogen, UA: 0.2 mg/dL (ref 0.0–1.0)
pH: 5.5 (ref 5.0–8.0)

## 2020-12-23 MED ORDER — SULFAMETHOXAZOLE-TRIMETHOPRIM 800-160 MG PO TABS: 1.0000 | ORAL_TABLET | Freq: Two times a day (BID) | ORAL | 0 refills | Status: AC

## 2020-12-23 NOTE — ED Provider Notes (Signed)
[facility]    CSN: [id number] Arrival date & time: 12/23/20  1012      History   Chief Complaint Chief Complaint  Patient presents with   Back Pain   Abscess    HPI Wesley Swanson is a 55 y.o. male.   Reports bilateral flank pain, dysuria, urinary frequency for the last 5 days. Also reports boil to his abdomen for the last week. Reports that he has use boil-ease with little relief. Has not attempted OTC treatment for dysuria. Denies previous symptoms. Denies chills, fever, abdominal pain, nausea, vomiting, diarrhea, other symptoms.   ROS per HPI  The history is provided by the patient.  Back Pain Abscess  Past Medical History:  Diagnosis Date   ED (erectile dysfunction)     Patient Active Problem List   Diagnosis Date Noted   OSA (obstructive sleep apnea) 12/27/2018   Diabetes mellitus without complication (HCC) 12/14/2018   PCP NOTES >>>>>>>>>>>> 07/13/2018   Morbid obesity (HCC) 07/13/2018   Sleep disorder 06/08/2014   Erectile dysfunction 06/08/2014   Annual physical exam 10/18/2013   Hypertension 10/18/2013   Cough, PNM CXR 10-07-13 09/19/2013    Past Surgical History:  Procedure Laterality Date   NO PAST SURGERIES         Home Medications    Prior to Admission medications   Medication Sig Start Date End Date Taking? Authorizing Provider  sulfamethoxazole-trimethoprim (BACTRIM DS) 800-160 MG tablet Take 1 tablet by mouth 2 (two) times daily for 7 days. 12/23/20 12/30/20 Yes Moshe Cipro, NP  amLODipine (NORVASC) 10 MG tablet Take 10 mg by mouth daily.    [provider]  doxazosin (CARDURA) 4 MG tablet Take 4 mg by mouth daily.    [provider]  meclizine (ANTIVERT) 25 MG tablet Take 1 tablet (25 mg total) by mouth 3 (three) times daily as needed for dizziness. 09/30/19   Moshe Cipro, NP  tadalafil (CIALIS) 10 MG tablet Take 1 tablet (10 mg total) by mouth daily as needed for erectile dysfunction. 04/15/19    Wanda Plump, MD    Family History Family History  Problem Relation Age of Onset   Hypertension Father    Diabetes Father    CAD Father 11       MI age 73   Hypertension Brother    Stroke Neg Hx    Colon cancer Neg Hx    Prostate cancer Neg Hx     Social History Social History   Tobacco Use   Smoking status: Every Day    Packs/day: 1.00    Years: 18.00    Pack years: 18.00    Types: Cigarettes   Smokeless tobacco: Never   Tobacco comments:    1 ppd  Vaping Use   Vaping Use: Never used  Substance Use Topics   Alcohol use: Yes    Alcohol/week: 3.0 - 4.0 standard drinks    Types: 3 - 4 Standard drinks or equivalent per week    Comment: occasionally, holidays   Drug use: No     Allergies   Sildenafil and Penicillins   Review of Systems Review of Systems  Musculoskeletal:  Positive for back pain.    Physical Exam Triage Vital Signs ED Triage Vitals  Enc Vitals Group     BP 12/23/20 1041 (!) 159/92     Pulse Rate 12/23/20 1041 98     Resp 12/23/20 1041 19     Temp 12/23/20 1041 99.3 F (37.4 C)  Temp Source 12/23/20 1041 Oral     SpO2 12/23/20 1041 99 %     Weight --      Height --      Head Circumference --      Peak Flow --      Pain Score 12/23/20 1038 8     Pain Loc --      Pain Edu? --      Excl. in GC? --    No data found.  Updated Vital Signs BP (!) 159/92 (BP Location: Right Arm)   Pulse 98   Temp 99.3 F (37.4 C) (Oral)   Resp 19   SpO2 99%   Visual Acuity Right Eye Distance:   Left Eye Distance:   Bilateral Distance:    Right Eye Near:   Left Eye Near:    Bilateral Near:     Physical Exam Vitals and nursing note reviewed.  Constitutional:      Appearance: Normal appearance. He is well-developed. He is obese.  HENT:     Head: Normocephalic and atraumatic.     Nose: Nose normal.     Mouth/Throat:     Mouth: Mucous membranes are moist.     Pharynx: Oropharynx is clear.  Eyes:     Extraocular Movements: Extraocular  movements intact.     Conjunctiva/sclera: Conjunctivae normal.     Pupils: Pupils are equal, round, and reactive to light.  Cardiovascular:     Rate and Rhythm: Normal rate and regular rhythm.  Pulmonary:     Effort: Pulmonary effort is normal. No respiratory distress.  Abdominal:     General: Bowel sounds are normal. There is no distension.     Palpations: There is no mass.     Tenderness: There is no abdominal tenderness. There is right CVA tenderness and left CVA tenderness. There is no guarding or rebound.     Hernia: No hernia is present.  Musculoskeletal:        General: Normal range of motion.     Cervical back: Normal range of motion and neck supple.  Skin:    General: Skin is warm and dry.     Capillary Refill: Capillary refill takes less than 2 seconds.          Comments: Area of erythema, warmth, tenderness noted, no drainage/bleeding/area of fluctuance noted  Neurological:     General: No focal deficit present.     Mental Status: He is alert and oriented to person, place, and time.  Psychiatric:        Mood and Affect: Mood normal.        Behavior: Behavior normal.        Thought Content: Thought content normal.     UC Treatments / Results  Labs (all labs ordered are listed, but only abnormal results are displayed) Labs Reviewed  POCT URINALYSIS DIPSTICK, ED / UC - Abnormal; Notable for the following components:      Result Value   Glucose, UA 250 (*)    Hgb urine dipstick SMALL (*)    Leukocytes,Ua SMALL (*)    All other components within normal limits  URINE CULTURE    EKG   Radiology No results found.  Procedures Procedures (including critical care time)  Medications Ordered in UC Medications - No data to display  Initial Impression / Assessment and Plan / UC Course  I have reviewed the triage vital signs and the nursing notes.  Pertinent labs & imaging results that were available  during my care of the patient were reviewed by me and  considered in my medical decision making (see chart for details).    Cellulitis Bilateral Flank Pain Dysuria  Prescribed Bactrim BID x 7 days Will culture urine given clinical presentation and UA results, will be in contact with abnormal results that require further treatment Push fluids May take AZO as needed Follow up with this office or with primary care if symptoms are persisting.  Follow up in the ER for high fever, trouble swallowing, trouble breathing, other concerning symptoms.   Final Clinical Impressions(s) / UC Diagnoses   Final diagnoses:  Bilateral flank pain  Dysuria  Cellulitis of abdominal wall     Discharge Instructions      I have sent in Bactrim for you to take twice a day for 7 days.  You may have a urinary tract infection.   We are going to culture your urine and will call you as soon as we have the results if they require a change in treatment.  Drink plenty of water, 8-10 glasses per day.   You may take AZO over the counter for painful urination.  Follow up with your primary care provider as needed.   Go to the Emergency Department if you experience severe pain, shortness of breath, high fever, or other concerns.       ED Prescriptions     Medication Sig Dispense Auth. Provider   sulfamethoxazole-trimethoprim (BACTRIM DS) 800-160 MG tablet Take 1 tablet by mouth 2 (two) times daily for 7 days. 14 tablet Moshe Cipro, NP      PDMP not reviewed this encounter.   Moshe Cipro, NP 12/23/20 1122

## 2020-12-23 NOTE — Discharge Instructions (Addendum)
I have sent in Bactrim for you to take twice a day for 7 days.  You may have a urinary tract infection.   We are going to culture your urine and will call you as soon as we have the results if they require a change in treatment.  Drink plenty of water, 8-10 glasses per day.   You may take AZO over the counter for painful urination.  Follow up with your primary care provider as needed.   Go to the Emergency Department if you experience severe pain, shortness of breath, high fever, or other concerns.

## 2020-12-23 NOTE — ED Triage Notes (Signed)
Pt reports lower back pain and increased urinary frequency x 5 days; boil in the abdomen. Reports boil cream gives no relief. Denies fever.

## 2020-12-24 LAB — URINE CULTURE: Culture: 40000 — AB

## 2021-07-21 ENCOUNTER — Encounter (HOSPITAL_COMMUNITY): Payer: Self-pay | Admitting: Emergency Medicine

## 2021-07-21 ENCOUNTER — Ambulatory Visit (HOSPITAL_COMMUNITY)
Admission: EM | Admit: 2021-07-21 | Discharge: 2021-07-21 | Disposition: A | Discharge status: Home / Self Care | Payer: No Typology Code available for payment source | Attending: Urgent Care | Admitting: Urgent Care

## 2021-07-21 ENCOUNTER — Other Ambulatory Visit: Payer: Self-pay

## 2021-07-21 DIAGNOSIS — L83 Acanthosis nigricans: Secondary | ICD-10-CM

## 2021-07-21 DIAGNOSIS — E1159 Type 2 diabetes mellitus with other circulatory complications: Secondary | ICD-10-CM | POA: Diagnosis not present

## 2021-07-21 DIAGNOSIS — I951 Orthostatic hypotension: Secondary | ICD-10-CM

## 2021-07-21 DIAGNOSIS — E1165 Type 2 diabetes mellitus with hyperglycemia: Secondary | ICD-10-CM | POA: Diagnosis not present

## 2021-07-21 DIAGNOSIS — I10 Essential (primary) hypertension: Secondary | ICD-10-CM

## 2021-07-21 DIAGNOSIS — R42 Dizziness and giddiness: Secondary | ICD-10-CM

## 2021-07-21 HISTORY — DX: Essential (primary) hypertension: I10

## 2021-07-21 LAB — CBG MONITORING, ED: Glucose-Capillary: 160 mg/dL — ABNORMAL HIGH (ref 70–99)

## 2021-07-21 MED ORDER — AMLODIPINE BESYLATE 10 MG PO TABS: 10.0000 mg | ORAL_TABLET | Freq: Every day | ORAL | 0 refills | Status: DC

## 2021-07-21 MED ORDER — MECLIZINE HCL 25 MG PO TABS: 25.0000 mg | ORAL_TABLET | Freq: Three times a day (TID) | ORAL | 0 refills | Status: DC | PRN

## 2021-07-21 NOTE — Discharge Instructions (Addendum)
I have refilled your amlodipine.  Please stop taking extra doxazosin.  Follow-up with your PCP to get a refill of amlodipine. Monitor BP at home with goal readings 120/80. Your symptoms today are most likely secondary to orthostatic hypotension, result from doxazosin use.  Please transition very slowly.  Stay hydrated with water. Please follow-up with your primary care physician to have a hemoglobin A1c test performed. I have refilled your previously prescribed Antivert for use on an as-needed basis.

## 2021-07-21 NOTE — ED Triage Notes (Signed)
Pt reports hx vertigo and was seen here couple years ago for it. This morning when got up at 4am noticed equilibrium was off.  Reports been out of one BP meds for 3 days and waiting on [place] to send to him.

## 2021-07-21 NOTE — ED Provider Notes (Signed)
[facility]    CSN: [id number] Arrival date & time: 07/21/21  1140      History   Chief Complaint Chief Complaint  Patient presents with   Dizziness    HPI Wesley Swanson is a 56 y.o. male.   Pleasant 56 year old male presents today with an acute onset of dizziness occurring at 4 AM this morning.  He states he has been having intermittent episodes every time he stands up or lays flat.  He states when not moving he does not have a dizziness episode.  He reports the symptoms lasted for roughly 5 to 6 seconds.  He states he resolved spontaneously.  He notes that 2 years ago he had a "bout of vertigo".  He was treated with meclizine at that time.  He states this feels similar.  He does have a history of hypertension but states for the past 4 days he ran out of his amlodipine.  To combat his elevated blood pressures he has been taking an extra half tablet of his doxazosin.  The highest his blood pressure has gotten at home is 160/100.  He denies any headache, vision changes, shortness of breath, chest pain, palpitations, weakness, slurred speech.  He does admit that his gait feels disturbed during the 5-second episode of vertigo, but denies any instability otherwise.    Dizziness Associated symptoms: no headaches and no weakness    Past Medical History:  Diagnosis Date   ED (erectile dysfunction)    Hypertension     Patient Active Problem List   Diagnosis Date Noted   OSA (obstructive sleep apnea) 12/27/2018   Diabetes mellitus without complication (HCC) 12/14/2018   PCP NOTES >>>>>>>>>>>> 07/13/2018   Morbid obesity (HCC) 07/13/2018   Sleep disorder 06/08/2014   Erectile dysfunction 06/08/2014   Annual physical exam 10/18/2013   Hypertension 10/18/2013   Cough, PNM CXR 10-07-13 09/19/2013    Past Surgical History:  Procedure Laterality Date   NO PAST SURGERIES         Home Medications    Prior to Admission medications   Medication Sig Start Date End Date  Taking? Authorizing Provider  amLODipine (NORVASC) 10 MG tablet Take 1 tablet (10 mg total) by mouth daily for 14 days. 07/21/21 08/04/21  Guy Sandifer L, PA  doxazosin (CARDURA) 4 MG tablet Take 4 mg by mouth daily.    [provider]  meclizine (ANTIVERT) 25 MG tablet Take 1 tablet (25 mg total) by mouth 3 (three) times daily as needed for up to 15 doses for dizziness. 07/21/21   Bowdy Bair L, PA  tadalafil (CIALIS) 10 MG tablet Take 1 tablet (10 mg total) by mouth daily as needed for erectile dysfunction. 04/15/19   Wanda Plump, MD    Family History Family History  Problem Relation Age of Onset   Hypertension Father    Diabetes Father    CAD Father 9       MI age 34   Hypertension Brother    Stroke Neg Hx    Colon cancer Neg Hx    Prostate cancer Neg Hx     Social History Social History   Tobacco Use   Smoking status: Every Day    Packs/day: 1.00    Years: 18.00    Pack years: 18.00    Types: Cigarettes   Smokeless tobacco: Never   Tobacco comments:    1 ppd  Vaping Use   Vaping Use: Never used  Substance Use Topics  Alcohol use: Yes    Alcohol/week: 3.0 - 4.0 standard drinks    Types: 3 - 4 Standard drinks or equivalent per week    Comment: occasionally, holidays   Drug use: No     Allergies   Sildenafil and Penicillins   Review of Systems Review of Systems  Constitutional: Negative.   HENT: Negative.    Respiratory: Negative.    Cardiovascular: Negative.   Gastrointestinal: Negative.   Neurological:  Positive for dizziness. Negative for tremors, syncope, facial asymmetry, weakness, light-headedness, numbness and headaches.    Physical Exam Triage Vital Signs ED Triage Vitals  Enc Vitals Group     BP 07/21/21 1149 (!) 145/94     Pulse Rate 07/21/21 1149 96     Resp 07/21/21 1149 18     Temp 07/21/21 1149 97.9 F (36.6 C)     Temp Source 07/21/21 1149 Oral     SpO2 07/21/21 1149 100 %     Weight --      Height --      Head  Circumference --      Peak Flow --      Pain Score 07/21/21 1147 0     Pain Loc --      Pain Edu? --      Excl. in GC? --    Orthostatic VS for the past 24 hrs:  BP- Lying Pulse- Lying BP- Sitting Pulse- Sitting BP- Standing at 0 minutes Pulse- Standing at 0 minutes  07/21/21 1346 163/90 96 (!) 142/93 105 138/81 108    Updated Vital Signs BP (!) 145/94 (BP Location: Right Arm)    Pulse 96    Temp 97.9 F (36.6 C) (Oral)    Resp 18    SpO2 100%   Visual Acuity Right Eye Distance:   Left Eye Distance:   Bilateral Distance:    Right Eye Near:   Left Eye Near:    Bilateral Near:     Physical Exam Vitals and nursing note reviewed.  Constitutional:      General: He is not in acute distress.    Appearance: Normal appearance. He is obese. He is not ill-appearing or toxic-appearing.  HENT:     Head: Normocephalic and atraumatic.     Right Ear: Tympanic membrane, ear canal and external ear normal. There is no impacted cerumen.     Left Ear: Tympanic membrane, ear canal and external ear normal. There is no impacted cerumen.     Nose: Nose normal. No congestion or rhinorrhea.     Mouth/Throat:     Mouth: Mucous membranes are moist.     Pharynx: Oropharynx is clear. No oropharyngeal exudate or posterior oropharyngeal erythema.  Eyes:     General: No visual field deficit or scleral icterus.       Right eye: No discharge.        Left eye: No discharge.     Extraocular Movements: Extraocular movements intact.     Conjunctiva/sclera: Conjunctivae normal.     Pupils: Pupils are equal, round, and reactive to light.  Cardiovascular:     Rate and Rhythm: Normal rate and regular rhythm.     Pulses: Normal pulses.     Heart sounds: No murmur heard. Pulmonary:     Effort: Pulmonary effort is normal. No respiratory distress.     Breath sounds: Normal breath sounds. No stridor. No rhonchi.  Chest:     Chest wall: No tenderness.  Musculoskeletal:  General: No swelling, tenderness,  deformity or signs of injury. Normal range of motion.     Cervical back: Normal range of motion and neck supple. No rigidity or tenderness.     Right lower leg: No edema.  Lymphadenopathy:     Cervical: No cervical adenopathy.  Skin:    General: Skin is warm.     Coloration: Skin is not jaundiced.     Findings: No bruising, erythema, lesion or rash.  Neurological:     General: No focal deficit present.     Mental Status: He is alert and oriented to person, place, and time. Mental status is at baseline.     Cranial Nerves: Cranial nerves 2-12 are intact. No cranial nerve deficit, dysarthria or facial asymmetry.     Sensory: Sensation is intact. No sensory deficit.     Motor: Motor function is intact. No weakness, tremor, atrophy, abnormal muscle tone or pronator drift.     Coordination: Coordination is intact. Romberg sign negative. Coordination normal. Finger-Nose-Finger Test normal.     Deep Tendon Reflexes: Reflexes are normal and symmetric.  Psychiatric:        Mood and Affect: Mood normal.        Behavior: Behavior normal.        Thought Content: Thought content normal.        Judgment: Judgment normal.     UC Treatments / Results  Labs (all labs ordered are listed, but only abnormal results are displayed) Labs Reviewed  CBG MONITORING, ED - Abnormal; Notable for the following components:      Result Value   Glucose-Capillary 160 (*)    All other components within normal limits    EKG   Radiology No results found.  Procedures Procedures (including critical care time)  Medications Ordered in UC Medications - No data to display  Initial Impression / Assessment and Plan / UC Course  I have reviewed the triage vital signs and the nursing notes.  Pertinent labs & imaging results that were available during my care of the patient were reviewed by me and considered in my medical decision making (see chart for details).     Dizziness -suspect most likely related to  taking extra doxazosin, and likely related to the orthostatic hypotension this has induced.  Patient is however also prescribing BPPV.  Has taken meclizine in the past, so we will restart Orthostatic hypotension -likely secondary to doxazosin.  Follow-up with your PCP.  Transition extremely slowly.  Stay hydrated with water. Essential hypertension -refilled amlodipine.  Patient has been out for 4 days.  Must follow-up with PCP for chronic med maintenance. Abnormal glucose-patient did have several sips of Dr. Reino KentPepper in the waiting room.  However, given his acanthosis nigricans and BMI, I would recommend further work-up by his PCP for diabetes mellitus.  I do not feel that his blood sugar of 160 is related to his dizziness in office today however.  Final Clinical Impressions(s) / UC Diagnoses   Final diagnoses:  Dizziness  Orthostatic hypotension  Essential hypertension  Acanthosis nigricans     Discharge Instructions      I have refilled your amlodipine.  Please stop taking extra doxazosin.  Follow-up with your PCP to get a refill of amlodipine. Monitor BP at home with goal readings 120/80. Your symptoms today are most likely secondary to orthostatic hypotension, result from doxazosin use.  Please transition very slowly.  Stay hydrated with water. Please follow-up with your primary care physician to have  a hemoglobin A1c test performed. I have refilled your previously prescribed Antivert for use on an as-needed basis.     ED Prescriptions     Medication Sig Dispense Auth. Provider   meclizine (ANTIVERT) 25 MG tablet Take 1 tablet (25 mg total) by mouth 3 (three) times daily as needed for up to 15 doses for dizziness. 15 tablet Sian Joles L, PA   amLODipine (NORVASC) 10 MG tablet Take 1 tablet (10 mg total) by mouth daily for 14 days. 14 tablet Eluzer Howdeshell L, [place]      PDMP not reviewed this encounter.   Maretta BeesCrain, Varetta Chavers L, [place] 07/21/21 1417

## 2021-07-21 NOTE — ED Notes (Signed)
Pt reported dizziness when laying flat, which resolved within 3 sec; denied any dizziness with sitting and standing.

## 2021-08-06 LAB — BASIC METABOLIC PANEL
BUN: 16 (ref 4–21)
CO2: 27 — AB (ref 13–22)
Chloride: 103 (ref 99–108)
Creatinine: 1.4 — AB (ref 0.6–1.3)
Glucose: 138
Potassium: 3.8 mEq/L (ref 3.5–5.1)
Sodium: 136 — AB (ref 137–147)

## 2021-08-06 LAB — HEPATIC FUNCTION PANEL
ALT: 99 U/L — AB (ref 10–40)
AST: 39 (ref 14–40)
Alkaline Phosphatase: 138 — AB (ref 25–125)
Bilirubin, Direct: 0.1 (ref 0.01–0.4)
Bilirubin, Total: 0.4

## 2021-08-06 LAB — COMPREHENSIVE METABOLIC PANEL
Albumin: 3.8 (ref 3.5–5.0)
Calcium: 8.7 (ref 8.7–10.7)
eGFR: 60

## 2021-08-06 LAB — HEMOGLOBIN A1C: Hemoglobin A1C: 6.7

## 2021-08-21 ENCOUNTER — Encounter: Payer: Self-pay | Admitting: Internal Medicine

## 2021-09-13 ENCOUNTER — Other Ambulatory Visit: Payer: Self-pay

## 2021-09-13 DIAGNOSIS — F4312 Post-traumatic stress disorder, chronic: Secondary | ICD-10-CM | POA: Insufficient documentation

## 2021-09-13 DIAGNOSIS — E8881 Metabolic syndrome: Secondary | ICD-10-CM | POA: Insufficient documentation

## 2021-09-13 DIAGNOSIS — E785 Hyperlipidemia, unspecified: Secondary | ICD-10-CM | POA: Insufficient documentation

## 2021-09-13 DIAGNOSIS — K760 Fatty (change of) liver, not elsewhere classified: Secondary | ICD-10-CM | POA: Insufficient documentation

## 2021-09-13 DIAGNOSIS — E559 Vitamin D deficiency, unspecified: Secondary | ICD-10-CM | POA: Insufficient documentation

## 2021-09-13 DIAGNOSIS — F17200 Nicotine dependence, unspecified, uncomplicated: Secondary | ICD-10-CM | POA: Insufficient documentation

## 2021-09-13 DIAGNOSIS — K7581 Nonalcoholic steatohepatitis (NASH): Secondary | ICD-10-CM | POA: Insufficient documentation

## 2021-09-13 DIAGNOSIS — N400 Enlarged prostate without lower urinary tract symptoms: Secondary | ICD-10-CM | POA: Insufficient documentation

## 2021-09-18 ENCOUNTER — Encounter: Payer: Self-pay | Admitting: Internal Medicine

## 2024-03-07 ENCOUNTER — Other Ambulatory Visit (HOSPITAL_BASED_OUTPATIENT_CLINIC_OR_DEPARTMENT_OTHER): Payer: Self-pay | Admitting: Physician Assistant

## 2024-03-07 DIAGNOSIS — Z87891 Personal history of nicotine dependence: Secondary | ICD-10-CM

## 2024-03-23 ENCOUNTER — Ambulatory Visit (HOSPITAL_BASED_OUTPATIENT_CLINIC_OR_DEPARTMENT_OTHER)
Admission: RE | Admit: 2024-03-23 | Discharge: 2024-03-23 | Disposition: A | Discharge status: Home / Self Care | Source: Ambulatory Visit | Attending: Physician Assistant | Admitting: Physician Assistant

## 2024-03-23 DIAGNOSIS — Z87891 Personal history of nicotine dependence: Secondary | ICD-10-CM | POA: Diagnosis present

## 2024-04-10 ENCOUNTER — Encounter (HOSPITAL_COMMUNITY): Payer: Self-pay | Admitting: Internal Medicine

## 2024-04-10 ENCOUNTER — Emergency Department (HOSPITAL_COMMUNITY)

## 2024-04-10 ENCOUNTER — Other Ambulatory Visit: Payer: Self-pay

## 2024-04-10 ENCOUNTER — Observation Stay (HOSPITAL_COMMUNITY): Admission: EM | Admit: 2024-04-10 | Discharge: 2024-04-11 | Disposition: A | Discharge status: Home / Self Care

## 2024-04-10 DIAGNOSIS — Z8639 Personal history of other endocrine, nutritional and metabolic disease: Secondary | ICD-10-CM | POA: Insufficient documentation

## 2024-04-10 DIAGNOSIS — F1020 Alcohol dependence, uncomplicated: Secondary | ICD-10-CM | POA: Insufficient documentation

## 2024-04-10 DIAGNOSIS — R0989 Other specified symptoms and signs involving the circulatory and respiratory systems: Secondary | ICD-10-CM | POA: Insufficient documentation

## 2024-04-10 DIAGNOSIS — N1831 Chronic kidney disease, stage 3a: Secondary | ICD-10-CM | POA: Diagnosis not present

## 2024-04-10 DIAGNOSIS — I10 Essential (primary) hypertension: Secondary | ICD-10-CM

## 2024-04-10 DIAGNOSIS — I129 Hypertensive chronic kidney disease with stage 1 through stage 4 chronic kidney disease, or unspecified chronic kidney disease: Secondary | ICD-10-CM | POA: Diagnosis not present

## 2024-04-10 DIAGNOSIS — I959 Hypotension, unspecified: Secondary | ICD-10-CM | POA: Diagnosis not present

## 2024-04-10 DIAGNOSIS — R10819 Abdominal tenderness, unspecified site: Secondary | ICD-10-CM | POA: Diagnosis not present

## 2024-04-10 DIAGNOSIS — K7581 Nonalcoholic steatohepatitis (NASH): Secondary | ICD-10-CM

## 2024-04-10 DIAGNOSIS — I951 Orthostatic hypotension: Secondary | ICD-10-CM | POA: Diagnosis not present

## 2024-04-10 DIAGNOSIS — E872 Acidosis, unspecified: Secondary | ICD-10-CM

## 2024-04-10 DIAGNOSIS — N189 Chronic kidney disease, unspecified: Secondary | ICD-10-CM

## 2024-04-10 DIAGNOSIS — F1090 Alcohol use, unspecified, uncomplicated: Secondary | ICD-10-CM

## 2024-04-10 DIAGNOSIS — R7989 Other specified abnormal findings of blood chemistry: Secondary | ICD-10-CM | POA: Diagnosis not present

## 2024-04-10 DIAGNOSIS — N179 Acute kidney failure, unspecified: Secondary | ICD-10-CM

## 2024-04-10 DIAGNOSIS — G4733 Obstructive sleep apnea (adult) (pediatric): Secondary | ICD-10-CM

## 2024-04-10 DIAGNOSIS — E1122 Type 2 diabetes mellitus with diabetic chronic kidney disease: Secondary | ICD-10-CM | POA: Insufficient documentation

## 2024-04-10 DIAGNOSIS — R55 Syncope and collapse: Secondary | ICD-10-CM | POA: Diagnosis present

## 2024-04-10 DIAGNOSIS — E119 Type 2 diabetes mellitus without complications: Secondary | ICD-10-CM

## 2024-04-10 DIAGNOSIS — E8729 Other acidosis: Secondary | ICD-10-CM

## 2024-04-10 DIAGNOSIS — Z7984 Long term (current) use of oral hypoglycemic drugs: Secondary | ICD-10-CM | POA: Insufficient documentation

## 2024-04-10 DIAGNOSIS — Z79899 Other long term (current) drug therapy: Secondary | ICD-10-CM | POA: Insufficient documentation

## 2024-04-10 DIAGNOSIS — Z7982 Long term (current) use of aspirin: Secondary | ICD-10-CM | POA: Insufficient documentation

## 2024-04-10 DIAGNOSIS — F17210 Nicotine dependence, cigarettes, uncomplicated: Secondary | ICD-10-CM | POA: Diagnosis not present

## 2024-04-10 DIAGNOSIS — E66811 Obesity, class 1: Secondary | ICD-10-CM | POA: Diagnosis not present

## 2024-04-10 DIAGNOSIS — Z6838 Body mass index (BMI) 38.0-38.9, adult: Secondary | ICD-10-CM | POA: Insufficient documentation

## 2024-04-10 DIAGNOSIS — M79605 Pain in left leg: Secondary | ICD-10-CM | POA: Insufficient documentation

## 2024-04-10 DIAGNOSIS — E861 Hypovolemia: Secondary | ICD-10-CM

## 2024-04-10 DIAGNOSIS — R000 Tachycardia, unspecified: Secondary | ICD-10-CM | POA: Insufficient documentation

## 2024-04-10 LAB — URINALYSIS, ROUTINE W REFLEX MICROSCOPIC
Bilirubin Urine: NEGATIVE
Glucose, UA: 50 mg/dL — AB
Hgb urine dipstick: NEGATIVE
Ketones, ur: 5 mg/dL — AB
Nitrite: NEGATIVE
Protein, ur: 100 mg/dL — AB
Specific Gravity, Urine: 1.017 (ref 1.005–1.030)
pH: 5 (ref 5.0–8.0)

## 2024-04-10 LAB — I-STAT CHEM 8, ED
BUN: 15 mg/dL (ref 6–20)
Calcium, Ion: 1.06 mmol/L — ABNORMAL LOW (ref 1.15–1.40)
Chloride: 106 mmol/L (ref 98–111)
Creatinine, Ser: 1.3 mg/dL — ABNORMAL HIGH (ref 0.61–1.24)
Glucose, Bld: 159 mg/dL — ABNORMAL HIGH (ref 70–99)
HCT: 41 % (ref 39.0–52.0)
Hemoglobin: 13.9 g/dL (ref 13.0–17.0)
Potassium: 3.7 mmol/L (ref 3.5–5.1)
Sodium: 142 mmol/L (ref 135–145)
TCO2: 18 mmol/L — ABNORMAL LOW (ref 22–32)

## 2024-04-10 LAB — CBC WITH DIFFERENTIAL/PLATELET
Abs Immature Granulocytes: 0.06 K/uL (ref 0.00–0.07)
Basophils Absolute: 0.1 K/uL (ref 0.0–0.1)
Basophils Relative: 1 %
Eosinophils Absolute: 0.3 K/uL (ref 0.0–0.5)
Eosinophils Relative: 4 %
HCT: 42.7 % (ref 39.0–52.0)
Hemoglobin: 13.7 g/dL (ref 13.0–17.0)
Immature Granulocytes: 1 %
Lymphocytes Relative: 41 %
Lymphs Abs: 2.9 K/uL (ref 0.7–4.0)
MCH: 29.7 pg (ref 26.0–34.0)
MCHC: 32.1 g/dL (ref 30.0–36.0)
MCV: 92.6 fL (ref 80.0–100.0)
Monocytes Absolute: 0.6 K/uL (ref 0.1–1.0)
Monocytes Relative: 9 %
Neutro Abs: 3.1 K/uL (ref 1.7–7.7)
Neutrophils Relative %: 44 %
Platelets: 169 K/uL (ref 150–400)
RBC: 4.61 MIL/uL (ref 4.22–5.81)
RDW: 13.8 % (ref 11.5–15.5)
WBC: 7.1 K/uL (ref 4.0–10.5)
nRBC: 0 % (ref 0.0–0.2)

## 2024-04-10 LAB — I-STAT VENOUS BLOOD GAS, ED
Acid-base deficit: 6 mmol/L — ABNORMAL HIGH (ref 0.0–2.0)
Acid-base deficit: 6 mmol/L — ABNORMAL HIGH (ref 0.0–2.0)
Bicarbonate: 17.7 mmol/L — ABNORMAL LOW (ref 20.0–28.0)
Bicarbonate: 17.2 mmol/L — ABNORMAL LOW (ref 20.0–28.0)
Calcium, Ion: 1.06 mmol/L — ABNORMAL LOW (ref 1.15–1.40)
Calcium, Ion: 1.08 mmol/L — ABNORMAL LOW (ref 1.15–1.40)
HCT: 43 % (ref 39.0–52.0)
HCT: 39 % (ref 39.0–52.0)
Hemoglobin: 14.6 g/dL (ref 13.0–17.0)
Hemoglobin: 13.3 g/dL (ref 13.0–17.0)
O2 Saturation: 94 %
O2 Saturation: 95 %
Potassium: 4.3 mmol/L (ref 3.5–5.1)
Potassium: 3.8 mmol/L (ref 3.5–5.1)
Sodium: 138 mmol/L (ref 135–145)
Sodium: 138 mmol/L (ref 135–145)
TCO2: 19 mmol/L — ABNORMAL LOW (ref 22–32)
TCO2: 18 mmol/L — ABNORMAL LOW (ref 22–32)
pCO2, Ven: 30 mmHg — ABNORMAL LOW (ref 44–60)
pCO2, Ven: 28.7 mmHg — ABNORMAL LOW (ref 44–60)
pH, Ven: 7.379 (ref 7.25–7.43)
pH, Ven: 7.386 (ref 7.25–7.43)
pO2, Ven: 71 mmHg — ABNORMAL HIGH (ref 32–45)
pO2, Ven: 74 mmHg — ABNORMAL HIGH (ref 32–45)

## 2024-04-10 LAB — RAPID URINE DRUG SCREEN, HOSP PERFORMED
Amphetamines: NOT DETECTED
Barbiturates: NOT DETECTED
Benzodiazepines: NOT DETECTED
Cocaine: NOT DETECTED
Opiates: NOT DETECTED
Tetrahydrocannabinol: NOT DETECTED

## 2024-04-10 LAB — COMPREHENSIVE METABOLIC PANEL WITH GFR
ALT: 69 U/L — ABNORMAL HIGH (ref 0–44)
AST: 70 U/L — ABNORMAL HIGH (ref 15–41)
Albumin: 3.4 g/dL — ABNORMAL LOW (ref 3.5–5.0)
Alkaline Phosphatase: 87 U/L (ref 38–126)
Anion gap: 16 — ABNORMAL HIGH (ref 5–15)
BUN: 15 mg/dL (ref 6–20)
CO2: 16 mmol/L — ABNORMAL LOW (ref 22–32)
Calcium: 8.5 mg/dL — ABNORMAL LOW (ref 8.9–10.3)
Chloride: 103 mmol/L (ref 98–111)
Creatinine, Ser: 1.32 mg/dL — ABNORMAL HIGH (ref 0.61–1.24)
GFR, Estimated: >60 mL/min (ref >60)
Glucose, Bld: 170 mg/dL — ABNORMAL HIGH (ref 70–99)
Potassium: 5 mmol/L (ref 3.5–5.1)
Sodium: 135 mmol/L (ref 135–145)
Total Bilirubin: 0.6 mg/dL (ref 0.0–1.2)
Total Protein: 6.6 g/dL (ref 6.5–8.1)

## 2024-04-10 LAB — I-STAT CG4 LACTIC ACID, ED
Lactic Acid, Venous: 4.6 mmol/L (ref 0.5–1.9)
Lactic Acid, Venous: 4.9 mmol/L (ref 0.5–1.9)
Lactic Acid, Venous: 4 mmol/L (ref 0.5–1.9)

## 2024-04-10 LAB — CBG MONITORING, ED: Glucose-Capillary: 163 mg/dL — ABNORMAL HIGH (ref 70–99)

## 2024-04-10 LAB — PROTIME-INR
INR: 1 (ref 0.8–1.2)
Prothrombin Time: 13.5 s (ref 11.4–15.2)

## 2024-04-10 LAB — TROPONIN I (HIGH SENSITIVITY)
Troponin I (High Sensitivity): 13 ng/L (ref <18)
Troponin I (High Sensitivity): 12 ng/L (ref <18)

## 2024-04-10 LAB — ETHANOL: Alcohol, Ethyl (B): 144 mg/dL — ABNORMAL HIGH (ref <15)

## 2024-04-10 MED ORDER — INSULIN ASPART 100 UNIT/ML IJ SOLN: 0.0000 [IU] | Freq: Every day | INTRAMUSCULAR | Status: DC

## 2024-04-10 MED ORDER — LACTATED RINGERS IV SOLN: INTRAVENOUS | Status: DC

## 2024-04-10 MED ORDER — ACETAMINOPHEN 500 MG PO TABS
1000.0000 mg | ORAL_TABLET | Freq: Once | ORAL | Status: AC
  Administered 2024-04-10: 1000 mg via ORAL
  Filled 2024-04-10: qty 2

## 2024-04-10 MED ORDER — LIDOCAINE 5 % EX PTCH
1.0000 | MEDICATED_PATCH | CUTANEOUS | Status: DC
  Administered 2024-04-10: 1 via TRANSDERMAL
  Filled 2024-04-10: qty 1

## 2024-04-10 MED ORDER — SODIUM CHLORIDE 0.9% FLUSH: 3.0000 mL | Freq: Two times a day (BID) | INTRAVENOUS | Status: DC

## 2024-04-10 MED ORDER — LORAZEPAM 1 MG PO TABS: 1.0000 mg | ORAL_TABLET | ORAL | Status: DC | PRN

## 2024-04-10 MED ORDER — ONDANSETRON HCL 4 MG PO TABS: 4.0000 mg | ORAL_TABLET | Freq: Four times a day (QID) | ORAL | Status: DC | PRN

## 2024-04-10 MED ORDER — MORPHINE SULFATE (PF) 4 MG/ML IV SOLN
4.0000 mg | Freq: Once | INTRAVENOUS | Status: AC
  Administered 2024-04-10: 4 mg via INTRAVENOUS
  Filled 2024-04-10: qty 1

## 2024-04-10 MED ORDER — KETOROLAC TROMETHAMINE 15 MG/ML IJ SOLN
15.0000 mg | Freq: Once | INTRAMUSCULAR | Status: AC
  Administered 2024-04-10: 15 mg via INTRAVENOUS
  Filled 2024-04-10: qty 1

## 2024-04-10 MED ORDER — THIAMINE HCL 100 MG/ML IJ SOLN: 100.0000 mg | Freq: Every day | INTRAMUSCULAR | Status: DC

## 2024-04-10 MED ORDER — LORAZEPAM 2 MG/ML IJ SOLN: 1.0000 mg | INTRAMUSCULAR | Status: DC | PRN

## 2024-04-10 MED ORDER — SODIUM BICARBONATE 8.4 % IV SOLN
25.0000 meq | INTRAVENOUS | Status: AC
  Administered 2024-04-11: 25 meq via INTRAVENOUS
  Filled 2024-04-10: qty 50

## 2024-04-10 MED ORDER — LACTATED RINGERS IV BOLUS
1000.0000 mL | Freq: Once | INTRAVENOUS | Status: AC
  Administered 2024-04-10: 1000 mL via INTRAVENOUS

## 2024-04-10 MED ORDER — ASPIRIN 81 MG PO TBEC
81.0000 mg | DELAYED_RELEASE_TABLET | Freq: Every day | ORAL | Status: DC
  Administered 2024-04-11: 81 mg via ORAL
  Filled 2024-04-10: qty 1

## 2024-04-10 MED ORDER — HYDROCODONE-ACETAMINOPHEN 5-325 MG PO TABS
1.0000 | ORAL_TABLET | Freq: Four times a day (QID) | ORAL | Status: DC | PRN
  Administered 2024-04-11 (×2): 1 via ORAL
  Filled 2024-04-10 (×2): qty 1

## 2024-04-10 MED ORDER — ONDANSETRON HCL 4 MG/2ML IJ SOLN: 4.0000 mg | Freq: Four times a day (QID) | INTRAMUSCULAR | Status: DC | PRN

## 2024-04-10 MED ORDER — SODIUM CHLORIDE 0.9% FLUSH: 3.0000 mL | INTRAVENOUS | Status: DC | PRN

## 2024-04-10 MED ORDER — THIAMINE MONONITRATE 100 MG PO TABS
100.0000 mg | ORAL_TABLET | Freq: Every day | ORAL | Status: DC
  Administered 2024-04-11: 100 mg via ORAL
  Filled 2024-04-10: qty 1

## 2024-04-10 MED ORDER — FOLIC ACID 1 MG PO TABS
1.0000 mg | ORAL_TABLET | Freq: Every day | ORAL | Status: DC
  Administered 2024-04-11: 1 mg via ORAL
  Filled 2024-04-10: qty 1

## 2024-04-10 MED ORDER — INSULIN ASPART 100 UNIT/ML IJ SOLN
0.0000 [IU] | Freq: Three times a day (TID) | INTRAMUSCULAR | Status: DC
  Administered 2024-04-11 (×2): 1 [IU] via SUBCUTANEOUS

## 2024-04-10 MED ORDER — SODIUM CHLORIDE 0.9 % IV SOLN: 250.0000 mL | INTRAVENOUS | Status: DC | PRN

## 2024-04-10 MED ORDER — ENOXAPARIN SODIUM 60 MG/0.6ML IJ SOSY
60.0000 mg | PREFILLED_SYRINGE | INTRAMUSCULAR | Status: DC
  Administered 2024-04-11: 60 mg via SUBCUTANEOUS
  Filled 2024-04-10: qty 0.6

## 2024-04-10 MED ORDER — ADULT MULTIVITAMIN W/MINERALS CH
1.0000 | ORAL_TABLET | Freq: Every day | ORAL | Status: DC
  Administered 2024-04-11: 1 via ORAL
  Filled 2024-04-10: qty 1

## 2024-04-10 MED ORDER — ACETAMINOPHEN 325 MG PO TABS
650.0000 mg | ORAL_TABLET | Freq: Four times a day (QID) | ORAL | Status: DC | PRN
  Administered 2024-04-11: 650 mg via ORAL
  Filled 2024-04-10: qty 2

## 2024-04-10 MED ORDER — ACETAMINOPHEN 650 MG RE SUPP: 650.0000 mg | Freq: Four times a day (QID) | RECTAL | Status: DC | PRN

## 2024-04-10 NOTE — ED Notes (Signed)
 CCMD called.

## 2024-04-10 NOTE — ED Notes (Signed)
 X-ray at bedside.

## 2024-04-10 NOTE — ED Notes (Signed)
 MD notified of Lactic result. No new orders at this time.

## 2024-04-10 NOTE — ED Provider Notes (Signed)
 Wesley Swanson Provider Note   CSN: [id number] Arrival date & time: 04/10/24  1654     History Chief Complaint  Patient presents with   Loss of Consciousness    HPI: Wesley Swanson is a 58 y.o. male with story pertinent for HTN, OSA, elevated BMI, T2DM, PTSD, NASH, BPPV, BPH, hyperlipidemia who presents complaining of loss of consciousness. Patient arrived via EMS.  History provided by patient.  No interpreter required during this encounter.  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  Reports that he developed immediate severe pain in his left knee and passed out due to the pain.  Reports that that he woke up several seconds later, did not have any chest pain or shortness of breath, he was helped into a chair, and became lightheaded and felt that he was going to pass out, therefore he told his friends who called the ambulance, and reportedly he did have recurrent loss of consciousness.  Patient was reportedly given bystander CPR during these episodes.  Patient denies any present or previous chest pain, shortness of breath.  Reports that he has had prior syncope due to musculoskeletal pain when he previously broke his ankle.  Reports that he was consuming alcohol prior to arrival.  Patient reportedly had low blood pressures on EMS arrival which improved with 500 cc of fluid en route.  Patient's recorded medical, surgical, social, medication list and allergies were reviewed in the Snapshot window as part of the initial history.   Prior to Admission medications   Medication Sig Start Date End Date Taking? Authorizing Provider  amLODipine  (NORVASC ) 10 MG tablet Take 10 mg by mouth daily. 10/11/19  Yes Provider, Historical, Swanson  aspirin EC 81 MG tablet Take 81 mg by mouth daily. Swallow whole.    Yes Provider, Historical, Swanson  ibuprofen  (ADVIL ) 800 MG tablet Take 1 tablet (800 mg total) by mouth every 8 (eight) hours as needed for moderate pain (pain score 4-6). 04/11/24  Yes Wesley Swanson  metFORMIN (GLUCOPHAGE) 500 MG tablet Take 500 mg by mouth 2 (two) times daily with a meal.   Yes Provider, Historical, Swanson     Allergies: Doxazosin, Jardiance [empagliflozin], Seroquel [quetiapine], Sildenafil, and Penicillins   Review of Systems   ROS as per HPI  Physical Exam Updated Vital Signs BP (!) 132/116 (BP Location: Right Arm)   Pulse 91   Temp 98 F (36.7 C) (Oral)   Resp 17   Ht 6' 1 (1.854 m)   Wt 133.8 kg   SpO2 99%   BMI 38.92 kg/m  Physical Exam Vitals and nursing note reviewed.  Constitutional:      General: He is not in acute distress.    Appearance: He is well-developed.  HENT:     Head: Normocephalic and atraumatic.  Eyes:     Conjunctiva/sclera: Conjunctivae normal.  Cardiovascular:     Rate and Rhythm: Normal rate and regular rhythm.     Heart sounds: No murmur heard. Pulmonary:     Effort: Pulmonary effort is normal. No respiratory distress.     Breath sounds: Normal breath sounds.  Abdominal:     Palpations: Abdomen is soft.     Tenderness: There is no abdominal tenderness.  Musculoskeletal:        General: No swelling.  Cervical back: Neck supple.     Right knee: Normal.     Left knee: No swelling, deformity, effusion or bony tenderness. Tenderness (The distal  lateral quadriceps) present. No LCL laxity, MCL laxity, ACL laxity or PCL laxity.      Legs:  Skin:    General: Skin is warm and dry.     Capillary Refill: Capillary refill takes less than 2 seconds.  Neurological:     Mental Status: He is alert.  Psychiatric:        Mood and Affect: Mood normal.     ED Course/ Medical Decision Making/ A&P    Procedures Procedures   Medications Ordered in ED Medications  perflutren lipid microspheres (DEFINITY) IV suspension (2 mLs  Intravenous Given 04/11/24 0827)  lactated ringers bolus 1,000 mL (0 mLs Intravenous Stopped 04/10/24 2000)  morphine (PF) 4 MG/ML injection 4 mg (4 mg Intravenous Given 04/10/24 1839)  ketorolac (TORADOL) 15 MG/ML injection 15 mg (15 mg Intravenous Given 04/10/24 2008)  acetaminophen (TYLENOL) tablet 1,000 mg (1,000 mg Oral Given 04/10/24 2008)  lactated ringers bolus 1,000 mL (0 mLs Intravenous Stopped 04/10/24 2219)  sodium bicarbonate injection 25 mEq (25 mEq Intravenous Given 04/11/24 0007)  oxyCODONE (Oxy IR/ROXICODONE) immediate release tablet 10 mg (10 mg Oral Given 04/11/24 0134)    Medical Decision Making:   Wesley Swanson is a 58 y.o. male who presents for syncope as per above.  Physical exam is pertinent for pain to palpation of the distal lateral quadriceps.   The differential includes but is not limited to fracture, dislocation,, strain, sprain, cardiac arrest, vasovagal syncope, vagovagal syncope, cardiogenic syncope.  Independent historian: None  External data reviewed: No pertinent external data  Labs: Ordered and Independent interpretation.  Details: Abnormal elevated at 144.  UDS negative.  VBG without acidosis or hypercarbia.  D-dimer WNL.  PT INR WNL.  UA equivocal for UTI, unconvincing in the absence of urinary symptoms. Lactic acid initially elevated at 4.6, delta of 4.9, further delta of 4.0.  Initial troponin 13, delta 12.  CBC without leukocytosis, anemia, thrombocytopenia.  MP without AKI, emergent electrolyte derangement, emergent LFT abnormality.  Radiology: Ordered, Independent interpretation, Details: Chest x-ray without focal airspace opacification, cardiomediastinal silhouette gentian, pneumothorax, pleural effusion, bony derangement.  X-ray of the left knee and femur without displaced fracture or dislocation, and All images reviewed independently.  Agree with radiology report at this time.   DG Femur Min 2 Views Left Result Date: 04/10/2024 CLINICAL DATA:   Left leg pain EXAM: LEFT FEMUR 2 VIEWS COMPARISON:  None Available. FINDINGS: No acute bony abnormality. Specifically, no fracture, subluxation, or dislocation. Small bone density in the suprapatellar region appears well corticated and likely related to old injury. IMPRESSION: No acute bony abnormality. Electronically Signed   By: Franky Crease M.D.   On: 04/10/2024 20:34   DG Knee Left Port Result Date: 04/10/2024 CLINICAL DATA:  Knee pain, fall EXAM: PORTABLE LEFT KNEE - 1-2 VIEW COMPARISON:  None Available. FINDINGS: There is a small bone density superior to the patella which appears well corticated and felt to reflect changes from old injury. No acute fracture, subluxation or dislocation. Joint spaces maintained. No joint effusion. IMPRESSION: No acute bony abnormality. Electronically Signed   By: Franky Crease M.D.   On: 04/10/2024 17:23   DG Chest Port 1 View Result Date: 04/10/2024 CLINICAL DATA:  Status post fall EXAM: PORTABLE CHEST 1 VIEW COMPARISON:  Chest radiograph dated 06/07/2014 FINDINGS: Normal lung volumes. No focal  consolidations. No pleural effusion or pneumothorax. The heart size and mediastinal contours are within normal limits. No radiographic finding of acute displaced fracture. IMPRESSION: No active disease. Electronically Signed   By: Limin  Xu M.D.   On: 04/10/2024 17:22   EKG/Medicine tests: Ordered and Independent interpretation EKG Interpretation: Sinus rhythm Probable anteroseptal infarct, old Minimal ST elevation, inferior leads Confirmed by Rogelia Satterfield ([id number]) on 04/10/2024 5:02:56 PM                Interventions: Morphine, Toradol, Tylenol, 2 L LR bolus  See the EMR for full details regarding lab and imaging results.  Patient presents to emergency department after fall and syncope (patient reports that fall preceded syncope, initial syncope was due to pain in his knee.  With regard to knee pain, x-ray is without fracture, dislocation, knee is stable to  ligamentous testing, patient has muscular tenderness on the lateral quadriceps, therefore the pain is most consistent with muscular strain.  With regard to syncope, patient reports that he has a history of syncope related pain, however patient also reportedly received brief CPR with both of his episodes of syncope, therefore do feel that patient requires screening labs.  Patient is negative for the [place] CT head and CT C-spine rule.  Chest x-ray reassuring.  Troponin X2 reassuring, doubt ACS, doubt infection or metabolic derangement given CBC and CMP reassuring, doubt PE given D-dimer WNL.  However, patient had significant elevation of lactic acid, initially to 4.6.  Patient did have transient hypotension with EMS which resolved with fluids and route, therefore considered that patient could have just had transient poor perfusion related to syncope, therefore given LR bolus and lactic acid rechecked, and increased to 4.9, though patient states that he is currently and symptomatic.  Notably patient contemporaneously developed mild tachycardia.  Given patient well-appearing, reassuring exam and subjectively feeling well, given further LR bolus, pain control, and repeat lactic acid obtained and still significantly elevated.  Patient persistently tachycardic despite pain medication.  Given persistent lactic acid elevation, tachycardia, patient has high risk syncope, therefore do feel that he warrants admission for fluid resuscitation, monitoring, and lactic acid trending.  Medicine consulted for admission, discussed with Dr. Sundil, who accepted the patient to her service.  Presentation is most consistent with acute complicated illness  Discussion of management or test interpretations with external provider(s): Dr. Sundil, hospitalists  Risk Drugs:OTC drugs and Prescription drug management Treatment: Decision regarding hospitalization  Disposition: ADMIT: I believe the patient requires admission for  further care and management. The patient was admitted to hospitalists. Please see inpatient provider note for additional treatment plan details.   MDM generated using voice dictation software and may contain dictation errors.  Please contact me for any clarification or with any questions.  Clinical Impression:  1. Syncope and collapse   2. Tachycardia   3. Acute leg pain, left      Admit   Final Clinical Impression(s) / ED Diagnoses Final diagnoses:  Syncope and collapse  Tachycardia    Rx / DC Orders ED Discharge Orders          Ordered    Call Swanson for:  temperature >100.4        04/11/24 1749    Call Swanson for:  persistant nausea and vomiting        04/11/24 1749    Call Swanson for:  redness, tenderness, or signs of infection (pain, swelling, redness, odor or green/yellow discharge around incision site)  04/11/24 1749    Call Swanson for:  difficulty breathing, headache or visual disturbances        04/11/24 1749    DME Crutches        04/11/24 1749    Discharge instructions       Comments: 1. Follow-up with your PCP within 1 week of discharge 2. Discuss options for alcohol cessation with your PCP 3.  For pain management, cycle Tylenol 1000 mg and ibuprofen    04/11/24 1749    ibuprofen  (ADVIL ) 800 MG tablet  Every 8 hours PRN        04/11/24 1749             Rogelia Jerilynn RAMAN, Swanson 04/15/24 1827

## 2024-04-10 NOTE — H&P (Addendum)
 History and Physical    Wesley Swanson FMW:[id number] DOB: 08-Aug-1965 DOA: 04/10/2024  PCP: [facility]   Patient coming from: Home   Chief Complaint:  Chief Complaint  Patient presents with   Loss of Consciousness   ED TRIAGE note:Pt bib gcems from home. Pt had possible syncopal episode, per family pt did not have pulses and was not breathing. He passed out, woke up, passed out again, family started cpr, then pt woke up again. C/O left knee pain. Pt endorsing drinking a lot of alcohol today, reports two beers and a fifth of vodka, denies drug use. Pt reports jumping up and his knee buckling and that's why he passed out.  60/40 initial pressure 146/80 185 cbg NSR 500 fluid 18G L wrist  HPI:  Wesley Swanson is a 58 y.o. male with medical history significant of NASH, hyperlipidemia, chronic alcohol use disorder, obstructive sleep apnea, non insulin dependent DM type II, morbid obesity and essential hypertension presented to emergency department for a presyncopal episode.  Patient reported that he has been drinking a lot of alcohol today reported jumping and suddenly his knee buckle and that is why he probably passed out.  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  Reports that he developed immediate severe pain in his left knee and passed out due to the pain.   Reports that that he woke up several seconds later, did not have any chest pain or shortness of breath, he was helped into a chair, and became lightheaded and felt that he was going to pass out, therefore he told his friends who called the ambulance, and reportedly he did have recurrent loss of consciousness.   Patient was reportedly given bystander CPR during these episodes.  Patient denies any present or previous chest pain, shortness of breath.  Reports that  he has had prior syncope due to musculoskeletal pain when he previously broke his ankle.  Reports that he was consuming alcohol prior to arrival.   Patient reportedly had low blood pressures on EMS arrival which improved with 500 cc of fluid en route.    ED Course:  At presentation to ED patient blood pressure within good range and borderline tachycardic heart rate up to 111.  Otherwise hemodynamically stable. Lab work, CBC unremarkable.  CMP showing low bicarb 16, creatinine 1.32 (at baseline), elevated AST/ALT 70/69, normal bilirubin level and elevated anion gap 16. Elevated blood alcohol level 144. Persistent elevated lactic acid is 4.6 in the setting of underlying hepatic cirrhosis and metabolic acidosis. Troponin x 2 within normal range. VBG unremarkable. UA showing evidence of UTI. UDS unremarkable.  Pending D-dimer level.  X-ray of the left femur no evidence of fracture.  X-ray of the left knee no evidence of fracture.  Chest x-ray unremarkable.  In the ED patient received 2 L of LR bolus Tylenol and morphine.  Hospitalist has been consulted for further evaluation management of syncope, hypotension, acute kidney injury, metabolic acidosis, lactic acidosis and alcohol use.  Significant labs in the ED: Lab Orders         CBC WITH DIFFERENTIAL         Urine rapid drug screen (hosp performed)         Comprehensive metabolic panel         Ethanol  Urinalysis, Routine w reflex microscopic -Urine, Clean Catch         Protime-INR         D-dimer, quantitative         Creatinine, urine, random         Sodium, urine, random         Comprehensive metabolic panel         CBC         HIV Antibody (routine testing w rflx)         Lactic acid, plasma         I-Stat Chem 8, ED         I-Stat Lactic Acid, ED         I-Stat CG4 Lactic Acid         I-Stat venous blood gas, ([facility], [facility], [facility])         I-Stat venous blood gas, ([facility], [facility], [facility])         CBG monitoring, ED        Review of Systems:  Review of Systems  Constitutional:  Negative for chills and fever.  Eyes:  Negative for blurred vision.  Respiratory:  Negative for cough, sputum production and shortness of breath.   Cardiovascular:  Negative for chest pain, palpitations and leg swelling.  Gastrointestinal:  Negative for abdominal pain, heartburn, nausea and vomiting.  Genitourinary:  Negative for dysuria and urgency.  Musculoskeletal:  Positive for joint pain. Negative for falls, myalgias and neck pain.       Knee joint pain  Neurological:  Positive for loss of consciousness. Negative for dizziness, tingling, tremors, sensory change, speech change, focal weakness, seizures, weakness and headaches.  Psychiatric/Behavioral:  The patient is not nervous/anxious.      Past Medical History:  Diagnosis Date   BPH (benign prostatic hyperplasia)    BPPV (benign paroxysmal positional vertigo)    ED (erectile dysfunction)    GERD (gastroesophageal reflux disease)    Hyperlipidemia    Hypertension    Metabolic syndrome X    NAFLD (nonalcoholic fatty liver disease)    OSA (obstructive sleep apnea)    PTSD (post-traumatic stress disorder)    Type 2 diabetes mellitus (HCC)    Vitamin D deficiency     Past Surgical History:  Procedure Laterality Date   NO PAST SURGERIES       reports that he has been smoking cigarettes. He has a 18 pack-year smoking history. He has never used smokeless tobacco. He reports current alcohol use of about 3.0 - 4.0 standard drinks of alcohol per week. He reports that he does not use drugs.  Allergies  Allergen Reactions   Doxazosin     Dizziness    Jardiance [Empagliflozin]     headache   Seroquel [Quetiapine]     Hallucinations    Sildenafil     Headache   Penicillins Rash    Family History  Problem Relation Age of Onset   Hypertension Father    Diabetes Father    CAD Father 20       MI age 25   Hypertension Brother    Stroke Neg Hx    Colon cancer  Neg Hx    Prostate cancer Neg Hx     Prior to Admission medications   Medication Sig Start Date End Date Taking? Authorizing Provider  amLODipine  (NORVASC ) 10 MG tablet Take 1 tablet (10 mg total) by mouth daily for 14 days. 07/21/21 08/04/21  Crain, Whitney  L, PA  Cholecalciferol 25 MCG (1000 UT) tablet Take 1 tablet by mouth daily. 08/01/21   [provider]  doxazosin (CARDURA) 8 MG tablet Take 0.5 tablets (4 mg total) by mouth daily. 09/13/21   Paz, Jose E, MD  hydrochlorothiazide (HYDRODIURIL) 25 MG tablet Take 0.5 mg by mouth daily. 12/05/20   [provider]  meclizine  (ANTIVERT ) 25 MG tablet Take 1 tablet (25 mg total) by mouth 3 (three) times daily as needed for up to 15 doses for dizziness. 07/21/21   Crain, Whitney L, PA  pravastatin (PRAVACHOL) 10 MG tablet Take 5 mg by mouth at bedtime. 08/28/21   [provider]  QUEtiapine (SEROQUEL) 25 MG tablet Take 1 tablet (25 mg total) by mouth at bedtime. 09/13/21   Amon Aloysius BRAVO, MD  tadalafil  (CIALIS ) 10 MG tablet Take 1 tablet (10 mg total) by mouth daily as needed for erectile dysfunction. 04/15/19   Amon Aloysius BRAVO, MD     Physical Exam: Vitals:   04/10/24 2042 04/10/24 2315 04/10/24 2345 04/11/24 0000  BP:  (!) 149/79 (!) 142/88 (!) 147/86  Pulse:  (!) 104 96 98  Resp:  20 13 13   Temp: 98.1 F (36.7 C)     TempSrc: Oral     SpO2:  98% 98% 99%  Weight:      Height:        Physical Exam Vitals and nursing note reviewed.  Constitutional:      Appearance: Normal appearance. He is obese. He is not ill-appearing.  Eyes:     Pupils: Pupils are equal, round, and reactive to light.  Cardiovascular:     Rate and Rhythm: Normal rate and regular rhythm.     Pulses: Normal pulses.     Heart sounds: Normal heart sounds.  Pulmonary:     Effort: Pulmonary effort is normal.     Breath sounds: Normal breath sounds.  Abdominal:     Palpations: Abdomen is soft.  Musculoskeletal:     Cervical back: Neck supple.     Right  lower leg: No edema.     Left lower leg: No edema.  Skin:    Capillary Refill: Capillary refill takes less than 2 seconds.  Neurological:     Mental Status: He is alert and oriented to person, place, and time.  Psychiatric:        Mood and Affect: Mood normal.      Labs on Admission: I have personally reviewed following labs and imaging studies  CBC: Recent Labs  Lab 04/10/24 1702 04/10/24 1733 04/10/24 2019 04/10/24 2216  WBC 7.1  --   --   --   NEUTROABS 3.1  --   --   --   HGB 13.7 13.9 14.6 13.3  HCT 42.7 41.0 43.0 39.0  MCV 92.6  --   --   --   PLT 169  --   --   --    Basic Metabolic Panel: Recent Labs  Lab 04/10/24 1702 04/10/24 1733 04/10/24 2019 04/10/24 2216  NA 135 142 138 138  K 5.0 3.7 4.3 3.8  CL 103 106  --   --   CO2 16*  --   --   --   GLUCOSE 170* 159*  --   --   BUN 15 15  --   --   CREATININE 1.32* 1.30*  --   --   CALCIUM 8.5*  --   --   --    GFR:  Estimated Creatinine Clearance: 91.1 mL/min (A) (by C-G formula based on SCr of 1.3 mg/dL (H)). Liver Function Tests: Recent Labs  Lab 04/10/24 1702  AST 70*  ALT 69*  ALKPHOS 87  BILITOT 0.6  PROT 6.6  ALBUMIN 3.4*   No results for input(s): LIPASE, AMYLASE in the last 168 hours. No results for input(s): AMMONIA in the last 168 hours. Coagulation Profile: Recent Labs  Lab 04/10/24 1702  INR 1.0   Cardiac Enzymes: Recent Labs  Lab 04/10/24 1940 04/10/24 2210  TROPONINIHS 13 12   BNP (last 3 results) No results for input(s): BNP in the last 8760 hours. HbA1C: No results for input(s): HGBA1C in the last 72 hours. CBG: Recent Labs  Lab 04/10/24 2356  GLUCAP 163*   Lipid Profile: No results for input(s): CHOL, HDL, LDLCALC, TRIG, CHOLHDL, LDLDIRECT in the last 72 hours. Thyroid  Function Tests: No results for input(s): TSH, T4TOTAL, FREET4, T3FREE, THYROIDAB in the last 72 hours. Anemia Panel: No results for input(s): VITAMINB12,  FOLATE, FERRITIN, TIBC, IRON, RETICCTPCT in the last 72 hours. Urine analysis:    Component Value Date/Time   COLORURINE YELLOW 04/10/2024 1815   APPEARANCEUR HAZY (A) 04/10/2024 1815   LABSPEC 1.017 04/10/2024 1815   PHURINE 5.0 04/10/2024 1815   GLUCOSEU 50 (A) 04/10/2024 1815   HGBUR NEGATIVE 04/10/2024 1815   BILIRUBINUR NEGATIVE 04/10/2024 1815   KETONESUR 5 (A) 04/10/2024 1815   PROTEINUR 100 (A) 04/10/2024 1815   UROBILINOGEN 0.2 12/23/2020 1055   NITRITE NEGATIVE 04/10/2024 1815   LEUKOCYTESUR SMALL (A) 04/10/2024 1815    Radiological Exams on Admission: I have personally reviewed images DG Femur Min 2 Views Left Result Date: 04/10/2024 CLINICAL DATA:  Left leg pain EXAM: LEFT FEMUR 2 VIEWS COMPARISON:  None Available. FINDINGS: No acute bony abnormality. Specifically, no fracture, subluxation, or dislocation. Small bone density in the suprapatellar region appears well corticated and likely related to old injury. IMPRESSION: No acute bony abnormality. Electronically Signed   By: Franky Crease M.D.   On: 04/10/2024 20:34   DG Knee Left Port Result Date: 04/10/2024 CLINICAL DATA:  Knee pain, fall EXAM: PORTABLE LEFT KNEE - 1-2 VIEW COMPARISON:  None Available. FINDINGS: There is a small bone density superior to the patella which appears well corticated and felt to reflect changes from old injury. No acute fracture, subluxation or dislocation. Joint spaces maintained. No joint effusion. IMPRESSION: No acute bony abnormality. Electronically Signed   By: Franky Crease M.D.   On: 04/10/2024 17:23   DG Chest Port 1 View Result Date: 04/10/2024 CLINICAL DATA:  Status post fall EXAM: PORTABLE CHEST 1 VIEW COMPARISON:  Chest radiograph dated 06/07/2014 FINDINGS: Normal lung volumes. No focal consolidations. No pleural effusion or pneumothorax. The heart size and mediastinal contours are within normal limits. No radiographic finding of acute displaced fracture. IMPRESSION: No active  disease. Electronically Signed   By: Limin  Xu M.D.   On: 04/10/2024 17:22     EKG: My personal interpretation of EKG shows: Normal sinus rhythm heart rate 96    Assessment/Plan: Principal Problem:   Syncope, vasovagal Active Problems:   Hypotension   Essential hypertension   Non-insulin dependent type 2 diabetes mellitus (HCC)   Obstructive sleep apnea   NASH (nonalcoholic steatohepatitis)   High anion gap metabolic acidosis   Lactic acidosis   Acute kidney injury superimposed on chronic kidney disease   Chronic alcohol use   Syncope and collapse    Assessment and Plan: Syncope likely  secondary from vasovagal and hypotension -Presented to emergency department 2 episode of syncope while patient was jumping and suddenly had left-sided knee joint pain and with pain response patient passed out.  Bystander did CPR once however it is unclear if patient actually lost pulse or not.  Patient denies any chest pain, shortness of breath, headache and blurry vision.  Patient does not have any generalized or focal weakness.  Denies any tingling numbness. -EMS reported initially patient found hypotensive blood pressure was 60/40 received 500 mL of NS bolus and blood pressure improved to 146/80. -Presentation to ED patient is hemodynamically stable. -Lab work, CBC unremarkable.  CMP showing low bicarb 16, creatinine 1.32 (at baseline), elevated AST/ALT 70/69, normal bilirubin level and elevated anion gap 16. Elevated blood alcohol level 144. Persistent elevated lactic acid is 4.6 in the setting of underlying hepatic cirrhosis and metabolic acidosis. Troponin x 2 within normal range. VBG unremarkable. UA showing evidence of UTI. UDS unremarkable. -In the setting of persistent tachycardia D-dimer has been ordered in the ED.  If D-dimer elevated need to obtain CTA chest to rule out PE.   - X-ray of the left femur no evidence of fracture.  X-ray of the left knee no evidence of fracture.  Chest  x-ray unremarkable. - In the ED patient received 2 L of LR bolus Tylenol and morphine. Concern for syncope in the setting of hypotension as well as vasovagal syncope from pain response from the knee joint. - Hypotension has been resolved. -Obtaining echocardiogram. - Continue cardiac monitoring for development of any arrhythmia. Addendum - Normal D-dimer level.  No indication to pursue CTA chest workup.   Hypotension-resolved History of essential hypertension -Patient found to be hypotensive per EMS initial blood pressure of 60/80 right now in the ED patient systolic blood pressure upper 869d range.  Hypotension has been resolved.  However patient is persistently tachycardic likely denies any chest pain, shortness of breath and palpitation. EKG unremarkable -Patient's hypotension has been resolved after receiving total 2 L of NS bolus in the ED.  Holding amlodipine  and if patient blood pressure remains persistently elevated will resume Norvasc ..   Type B lactic acidosis in the setting of metformin use, NASH and alcohol use -Persistently evaded lactic acid 4.6-4.  Patient blood pressure has been improved and systolic blood pressure upper 859d range is still persistent lactic acidosis so elevated lactic acid is not explained by hypotension rather than underlying metformin use, NASH and alcohol use.  Due to underlying hepatic steatosis patient has poor lactic acid clearance. - At this time there is no concern for sepsis driven elevated lactic acidosis. -After giving sodium bicarb will check lactic acid level again.   Acute kidney injury CKD stage III A -Elevated creatinine 1.32.  Baseline creatinine is around 1.2 however patient has episode of AKI 2 years ago when creatinine was 1.4. - Pre-renal acute kidney injury in setting of AKI. UA evidence of UTI however patient does not have any UTI symptoms. Checking urine sodium and creatinine - Continue maintenance fluid LR 75 cc/h.  Avoid  nephrotoxic agent.  Monitor urine output  Alcohol use -Patient has heavy alcohol use on daily basis.  He drank total 2 cans of beer and fifth of vodka. -Patient is persistently tachycardic. - Starting CIWA protocol, as needed Ativan, folic acid and thiamine.  Anion gap metabolic acidosis-secondary to AKI, alcohol use and lactic acidosis -Low bicarb 16 and elevated anion gap 16.  Giving 1 amp of bicarb.   Non-insulin-dependent DM  type II -Holding metformin in setting of persistent lactic acidosis - Continue diabetic diet and sliding scale insulin coverage with mealtime.   History of Nash -Slightly elevated AST/ALT.  History of Hollie from morbid obesity and chronic alcohol use.  Morbid obesity Obstructive sleep apnea -Patient does not use CPAP at bedtime.  Continue to check pulse ox and supplemental oxygen as needed to keep O2 sat above 92%   DVT prophylaxis:  Lovenox Code Status:  Full Code Diet: Heart healthy carb modified Disposition Plan: If D-dimer elevated need to obtain CTA chest rule out PE.  Pending echocardiogram Consults: None indicated at this time Admission status:   Inpatient, Telemetry bed  Severity of Illness: The appropriate patient status for this patient is INPATIENT. Inpatient status is judged to be reasonable and necessary in order to provide the required intensity of service to ensure the patient's safety. The patient's presenting symptoms, physical exam findings, and initial radiographic and laboratory data in the context of their chronic comorbidities is felt to place them at high risk for further clinical deterioration. Furthermore, it is not anticipated that the patient will be medically stable for discharge from the hospital within 2 midnights of admission.   * I certify that at the point of admission it is my clinical judgment that the patient will require inpatient hospital care spanning beyond 2 midnights from the point of admission due to high intensity  of service, high risk for further deterioration and high frequency of surveillance required.DEWAINE    Chrisy Hillebrand, MD [facility]  How to contact the [facility] Attending or Consulting provider 7A - 7P or covering provider during after hours 7P -7A, for this patient.  Check the care team in [facility] and look for a) attending/consulting [facility] provider listed and b) the [facility] team listed Log into [email or web address] and use 's universal password to access. If you do not have the password, please contact the hospital operator. Locate the [facility] provider you are looking for under [facility] and page to a number that you can be directly reached. If you still have difficulty reaching the provider, please page the [facility] (Director on Call) for the Hospitalists listed on amion for assistance.  04/11/2024, 12:34 AM

## 2024-04-10 NOTE — ED Triage Notes (Addendum)
 Pt bib gcems from home. Pt had possible syncopal episode, per family pt did not have pulses and was not breathing. He passed out, woke up, passed out again, family started cpr, then pt woke up again. C/O left knee pain. Pt endorsing drinking a lot of alcohol today, reports two beers and a fifth of vodka, denies drug use. Pt reports jumping up and his knee buckling and that's why he passed out.  60/40 initial pressure 146/80 185 cbg NSR 500 fluid 18G L wrist

## 2024-04-11 ENCOUNTER — Inpatient Hospital Stay (HOSPITAL_COMMUNITY)

## 2024-04-11 DIAGNOSIS — I951 Orthostatic hypotension: Secondary | ICD-10-CM | POA: Diagnosis not present

## 2024-04-11 DIAGNOSIS — R55 Syncope and collapse: Secondary | ICD-10-CM | POA: Diagnosis not present

## 2024-04-11 LAB — CBC
HCT: 38.9 % — ABNORMAL LOW (ref 39.0–52.0)
Hemoglobin: 12.7 g/dL — ABNORMAL LOW (ref 13.0–17.0)
MCH: 29.6 pg (ref 26.0–34.0)
MCHC: 32.6 g/dL (ref 30.0–36.0)
MCV: 90.7 fL (ref 80.0–100.0)
Platelets: 159 K/uL (ref 150–400)
RBC: 4.29 MIL/uL (ref 4.22–5.81)
RDW: 13.8 % (ref 11.5–15.5)
WBC: 7.2 K/uL (ref 4.0–10.5)
nRBC: 0 % (ref 0.0–0.2)

## 2024-04-11 LAB — COMPREHENSIVE METABOLIC PANEL WITH GFR
ALT: 55 U/L — ABNORMAL HIGH (ref 0–44)
AST: 52 U/L — ABNORMAL HIGH (ref 15–41)
Albumin: 2.9 g/dL — ABNORMAL LOW (ref 3.5–5.0)
Alkaline Phosphatase: 70 U/L (ref 38–126)
Anion gap: 13 (ref 5–15)
BUN: 11 mg/dL (ref 6–20)
CO2: 17 mmol/L — ABNORMAL LOW (ref 22–32)
Calcium: 8 mg/dL — ABNORMAL LOW (ref 8.9–10.3)
Chloride: 101 mmol/L (ref 98–111)
Creatinine, Ser: 1.05 mg/dL (ref 0.61–1.24)
GFR, Estimated: >60 mL/min (ref >60)
Glucose, Bld: 228 mg/dL — ABNORMAL HIGH (ref 70–99)
Potassium: 3.8 mmol/L (ref 3.5–5.1)
Sodium: 131 mmol/L — ABNORMAL LOW (ref 135–145)
Total Bilirubin: 0.6 mg/dL (ref 0.0–1.2)
Total Protein: 5.6 g/dL — ABNORMAL LOW (ref 6.5–8.1)

## 2024-04-11 LAB — LACTIC ACID, PLASMA
Lactic Acid, Venous: 3.6 mmol/L (ref 0.5–1.9)
Lactic Acid, Venous: 2.5 mmol/L (ref 0.5–1.9)
Lactic Acid, Venous: 2 mmol/L (ref 0.5–1.9)

## 2024-04-11 LAB — CBG MONITORING, ED
Glucose-Capillary: 164 mg/dL — ABNORMAL HIGH (ref 70–99)
Glucose-Capillary: 148 mg/dL — ABNORMAL HIGH (ref 70–99)
Glucose-Capillary: 199 mg/dL — ABNORMAL HIGH (ref 70–99)

## 2024-04-11 LAB — ECHOCARDIOGRAM COMPLETE
Area-P 1/2: 4.12 cm2
Calc EF: 69.4 %
Height: 73 in
S' Lateral: 2.5 cm
Single Plane A2C EF: 65.9 %
Single Plane A4C EF: 71 %
Weight: 4720 [oz_av]

## 2024-04-11 LAB — SODIUM, URINE, RANDOM: Sodium, Ur: 107 mmol/L

## 2024-04-11 LAB — HIV ANTIBODY (ROUTINE TESTING W REFLEX): HIV Screen 4th Generation wRfx: NONREACTIVE

## 2024-04-11 LAB — D-DIMER, QUANTITATIVE: D-Dimer, Quant: 0.35 ug{FEU}/mL (ref 0.00–0.50)

## 2024-04-11 LAB — CREATININE, URINE, RANDOM: Creatinine, Urine: 180 mg/dL

## 2024-04-11 MED ORDER — IBUPROFEN 800 MG PO TABS: 800.0000 mg | ORAL_TABLET | Freq: Three times a day (TID) | ORAL | 0 refills | Status: AC | PRN

## 2024-04-11 MED ORDER — PERFLUTREN LIPID MICROSPHERE
1.0000 mL | INTRAVENOUS | Status: AC | PRN
  Administered 2024-04-11: 2 mL via INTRAVENOUS

## 2024-04-11 MED ORDER — HYDRALAZINE HCL 20 MG/ML IJ SOLN: 10.0000 mg | Freq: Four times a day (QID) | INTRAMUSCULAR | Status: DC | PRN

## 2024-04-11 MED ORDER — OXYCODONE HCL 5 MG PO TABS
10.0000 mg | ORAL_TABLET | Freq: Once | ORAL | Status: AC
  Administered 2024-04-11: 10 mg via ORAL
  Filled 2024-04-11: qty 2

## 2024-04-11 MED ORDER — AMLODIPINE BESYLATE 5 MG PO TABS
10.0000 mg | ORAL_TABLET | Freq: Every day | ORAL | Status: DC
  Administered 2024-04-11: 10 mg via ORAL
  Filled 2024-04-11: qty 2

## 2024-04-11 NOTE — Progress Notes (Signed)
 Orthopedic Tech Progress Note Patient Details:  Pistol Kessenich [birth year] [id number]  Ortho Devices Type of Ortho Device: Crutches Ortho Device/Splint Interventions: Ordered, Application, Adjustment   Post Interventions Patient Tolerated: Well, Ambulated well Instructions Provided: Poper ambulation with device, Care of device  Delanna LITTIE Pac 04/11/2024, 6:21 PM

## 2024-04-11 NOTE — Hospital Course (Addendum)
 58 year old male with PMHx NAFLD, HLD, chronic alcohol abuse disorder, OSA, T2DM, HTN, who presented to the emergency department on 04/10/2024 for syncopal episode.  Per the patient's report and through collateral obtained from the patient's friend over the phone (in the presence of the patient), he had been consuming a good amount of alcohol (7 shots of vodka and 2 beers) while watching football.  He reports reacting to a field goal being scored by jumping up abruptly from a seated position, landing on his left knee which buckled under him, and falling then passing out.  He reports regaining consciousness a few seconds later, and he was helped to stand up, but became lightheaded again and had a second syncopal episode. They were unable to definitely state if he hit his head. EMS reported initial blood pressure of 60/40 which improved with a 500 cc bolus to 146/80.  On arrival to the ED, the patient was afebrile with a heart rate of 93, BP 119/83, SpO2 100% on RA.  CBC was unremarkable.  CMP was remarkable for K5.0, bicarb 16, BG 170, CR 1.32, AST 70, ALT 69, anion gap 16.  Ethanol elevated to 144.  PT/INR WNL.  UDS negative.  VBG unremarkable.  UA with 50 glucose 5 ketones, 100 protein, small leukocytes, rare bacteria.  High-sensitivity troponins unremarkable X 2.  Lactate initially elevated 4.6 with slight worsening to 4.9.  X-rays of the left femur and left knee showed no acute bony abnormalities.  CXR showed no active disease.  He received an LR 2L bolus in the ED, Tylenol, and morphine.  He was admitted for management of syncope, hypotension, AKI, lactic acidosis.

## 2024-04-11 NOTE — Discharge Instructions (Signed)
 Follow-up with your PCP within 1 week of discharge Discuss options for alcohol cessation and weight loss with your PCP For pain management, you can cycle Tylenol 1,000 mg and then ibuprofen  800 mg after 4 hours followed again by Tylenol 1,000 mg after 4 hours and so on.  Make sure to take ibuprofen  with food.

## 2024-04-11 NOTE — ED Notes (Signed)
 Pt placed on hospital bed for comfort.

## 2024-04-11 NOTE — Progress Notes (Signed)
  Echocardiogram 2D Echocardiogram has been performed.  Wesley Swanson 04/11/2024, 8:28 AM

## 2024-04-11 NOTE — Progress Notes (Signed)
 Progress Note   Patient: Wesley Swanson FMW:[id number] DOB: Jun 25, 1966 DOA: 04/10/2024     1 DOS: the patient was seen and examined on 04/11/2024   Brief hospital course: 58 year old male with PMHx NASH, HLD, chronic alcohol use disorder, OSA, T2DM, HTN, who presented to the emergency department on 04/10/2024 for syncopal episode.  Per the patient's report, he had been consuming a good amount of alcohol (2 beers and 1/5 of vodka) while watching football.  He reports reacting to field goal being scored by jumping up, landing on his left knee, developing immediate severe pain, and passing out due to pain.  He reports regaining consciousness a few seconds later, was helped into a chair, but became lightheaded again and had a second syncopal episode.  He reportedly received bystander CPR at that time.  Of note, he reports prior syncopal episode due to musculoskeletal pain after suffering a fractured ankle.  EMS reported initial blood pressure of 60/40 which improved with a 500 cc bolus to 146/80.  On arrival to the ED, the patient was afebrile with a heart rate of 93, BP 119/83, SpO2 100% on RA.  CBC was unremarkable.  CMP was remarkable for K5.0, bicarb 16, BG 170, CR 1.32, AST 70, ALT 69, anion gap 16.  Ethanol elevated to 144.  PT/INR WNL.  UDS negative.  VBG unremarkable.  UA with 50 glucose 5 ketones, 100 protein, small leukocytes, rare bacteria.  High-sensitivity troponins unremarkable X 2.  Lactate initially elevated 4.6 with slight worsening to 4.9.  X-rays of the left femur and left knee showed no acute bony abnormalities.  CXR showed no active disease.  He received an LR 2L bolus in the ED, Tylenol, and morphine.  He was admitted for management of syncope, hypotension, AKI, lactic acidosis.  Assessment and Plan:  # Syncopal episode - Etiology difficult to ascertain given poor history and inability to obtain collateral given the patient's request not to contact his friends who were with him at  the time. Questionable unverifiable history regarding loss of pulses/breathing and bystander CPR. However, likely multifactorial including vasovagal syncope in the setting of acute onset pain, concomitant dehydration, and heavy alcohol use, as evidenced by EMS report of hypotension to 60/40 on arrival with appropriate response to a 500 mL bolus, elevated ethanol level to 144, and elevated lactate to 4.9. - Ethanol 144 - UDS unremarkable - PE ruled out on negative D-dimer - Continuous cardiac monitoring - Echocardiogram pending  # Left leg pain - Patient reports acute onset left leg pain after jumping and landing on his left leg.  Pain is persistent, he is unable to flex his knee or bear weight. - X-rays of left femur and left knee showed no acute bony abnormalities - As needed analgesics - PT OT consulted  #Primary hypertension #Hypotension -- resolved - Per EMS's report, patient hypotensive to 60/40 on their arrival.  Improved to 146/80 with 500 mL bolus of fluids. - Patient hypertensive this morning with SBP in the 160s - Resumed home amlodipine  10 mg daily  # Lactic acidosis, type B - Presented with elevated lactic acid to 4.6 with worsening to 4.9 before starting to trend down slowly. - Thought to be multifactorial, potentially with hypotension, metformin use, NASH with poor lactic acid clearance, and concomitant alcohol use playing a role. - Maintain that low likelihood of sepsis driving lactic acidosis initially - Received 1/2 amp bicarb push - Lactic acid trended down to 2.5 - Continue LR at 75 mL/hr - Repeat  lactic acid ordered  # AKI - resolved - Cr elevated to 1.32 on admission - Most likely prerenal in the setting of dehydration and hypotension - Improved with IVF's - Cr 1.05 today  #Alcohol abuse - Patient reports drinking significant alcohol prior to admission including 2 cans of beer and 6-7 shots of vodka - Continue CIWA protocol with as needed Ativan - Continue  daily folic acid and thiamine supplementation  #High anion gap metabolic acidosis - resolving - Presented with bicarb 16, corrected anion gap of 17.5, delta ratio of 0.7  - Likely uncomplicated HAGMA driven by lactic acidosis with delta ratio < 1 due to concomitant acute renal failure - Improving with IV hydration  #T2DM - Holding home metformin in the setting of lactic acidosis - Consistent carb diet - SSI and ACHS Accu-Cheks  #History of NASH #Elevated LFTs - AST and ALT improving to 52 and 55 respectively - Patient would benefit from PCP follow-up to discuss lifestyle modifications and weight loss  # Class II Obesity (BMI Body mass index is 38.92 kg/m. kg/m) - Obesity is clinically significant and contributes to T2DM, HTN, HLD, OSA, and NAFLD/NASH.  - Discussed lifestyle modification including dietary changes, regular physical activity, and weight reduction strategies. Will continue to monitor weight and BMI during hospitalization.       Subjective: Patient seen at bedside this morning.  Attempted to obtain history of the events that precipitated before his arrival to the ED, however, the patient is a poor historian and was unable to provide any substantial information that would help delineate the cause of his syncopal episode.  He maintains that he jumped up and felt significant sharp pain in his left leg, fell to the floor, lost consciousness for few seconds.  Upon waking up, he was helped to his feet by his friends but his left leg gave out on him and he fell back down.  At this point, he states he is unsure if he lost consciousness but recalls his friends were putting ice on him and ultimately EMS arrived.  He does recall being dizzy at the time of EMSs arrival.  At this time, his only concern is his left leg pain states he cannot flex it, or bear weight.  He denies any chest pain, dizziness, shortness of breath, abdominal pain, fevers, chills. Of note, the patient requested that  his friends not be contacted for collateral.  Physical Exam: Vitals:   04/11/24 0345 04/11/24 0430 04/11/24 0519 04/11/24 0610  BP: (!) 159/97 (!) 151/81  (!) 173/95  Pulse: 98 98  95  Resp: 17 18  16   Temp:   98.2 F (36.8 C)   TempSrc:   Oral   SpO2: 98% 99%  94%  Weight:      Height:       Physical Exam Constitutional:      General: He is not in acute distress.    Appearance: He is obese. He is not ill-appearing.  HENT:     Mouth/Throat:     Mouth: Mucous membranes are moist.  Eyes:     Pupils: Pupils are equal, round, and reactive to light.  Cardiovascular:     Rate and Rhythm: Normal rate and regular rhythm.     Heart sounds: Normal heart sounds. No murmur heard. Pulmonary:     Effort: Pulmonary effort is normal. No respiratory distress.     Breath sounds: Rhonchi (Faint bilaterally) present. No wheezing.  Abdominal:     General: Bowel sounds  are normal. There is no distension.     Palpations: Abdomen is soft.     Tenderness: There is no abdominal tenderness. There is no guarding.  Musculoskeletal:     Left upper leg: Tenderness (Focal tenderness over the left lateral distal femur above the knee.) present.     Left knee: No swelling or bony tenderness. Decreased range of motion. No tenderness.     Right lower leg: 1+ Pitting Edema present.     Left lower leg: No swelling or tenderness (Focal tenderness over the left lateral distal femur above the lateral knee.). 1+ Pitting Edema present.       Legs:  Skin:    General: Skin is warm and dry.     Capillary Refill: Capillary refill takes less than 2 seconds.  Neurological:     Mental Status: He is alert and oriented to person, place, and time. Mental status is at baseline.     GCS: GCS eye subscore is 4. GCS verbal subscore is 5. GCS motor subscore is 6.     Sensory: Sensation is intact.     Motor: Motor function is intact.     Coordination: Coordination is intact.     Family Communication:  None  Disposition: Status is: Inpatient Remains inpatient appropriate because: Pending further workup with syncope and PT/OT eval  Planned Discharge Destination: Home    Time spent: 45 minutes  Author: Duffy Larch, MD 04/11/2024 8:33 AM  For on call review [email or web address].

## 2024-04-11 NOTE — Discharge Summary (Signed)
 Physician Discharge Summary   Patient: Wesley Swanson MRN: [id number] DOB: Sep 18, 1965  Admit date:     04/10/2024  Discharge date: 04/11/24  Discharge Physician: Duffy Al-Sultani   PCP: [facility]   Recommendations at discharge:   [facility] within the next 3 days for evaluation of leg pain as well as discussion of options for obesity management and alcohol cessation, and repeat appropriate lab work including lactic acid and LFTs  Discharge Diagnoses: Principal Problem:   Syncope due to orthostatic hypotension Active Problems:   Hypotension   Essential hypertension   Non-insulin dependent type 2 diabetes mellitus (HCC)   Obstructive sleep apnea   NASH (nonalcoholic steatohepatitis)   High anion gap metabolic acidosis   Lactic acidosis   Acute kidney injury superimposed on chronic kidney disease   Chronic alcohol use   Syncope and collapse   Hospital Course: 58 year old male with PMHx NAFLD, HLD, chronic alcohol abuse disorder, OSA, T2DM, HTN, who presented to the emergency department on 04/10/2024 for syncopal episode.  Per the patient's report and through collateral obtained from the patient's friend over the phone (in the presence of the patient), he had been consuming a good amount of alcohol (7 shots of vodka and 2 beers) while watching football.  He reports reacting to a field goal being scored by jumping up abruptly from a seated position, landing on his left knee which buckled under him, and falling then passing out.  He reports regaining consciousness a few seconds later, and he was helped to stand up, but became lightheaded again and had a second syncopal episode. They were unable to definitely state if he hit his head. EMS reported initial blood pressure of 60/40 which improved with a 500 cc bolus to 146/80.  On arrival to the ED, the patient was afebrile with a heart rate of 93, BP 119/83, SpO2 100% on RA.  CBC was unremarkable.  CMP was  remarkable for K5.0, bicarb 16, BG 170, CR 1.32, AST 70, ALT 69, anion gap 16.  Ethanol elevated to 144.  PT/INR WNL.  UDS negative.  VBG unremarkable.  UA with 50 glucose 5 ketones, 100 protein, small leukocytes, rare bacteria.  High-sensitivity troponins unremarkable X 2.  Lactate initially elevated 4.6 with slight worsening to 4.9.  X-rays of the left femur and left knee showed no acute bony abnormalities.  CXR showed no active disease.  He received an LR 2L bolus in the ED, Tylenol, and morphine.  He was admitted for management of syncope, hypotension, AKI, lactic acidosis.  # Syncopal episode likely secondary to orthostatic hypotension - Etiology most likely orthostatic hypotension in the setting of significant alcohol use and overall dehydration, as evidenced by EMS report of hypotension to 60/40 on arrival with appropriate response to a 500 mL bolus, elevated ethanol level to 144, and elevated lactate to 4.9. Element of vasovagal syncope in the setting of acute onset pain is also possible.  - Ethanol 144 - UDS unremarkable - PE ruled out on negative D-dimer - Continuous cardiac monitoring should no abnormal rhythms - Echocardiogram showed an LVEF of 60-65% with no valvular abnormalities - The patient received approximately 3.5L of isotonic fluids - Orthostatic vitals prior to discharge were unremarkable - Discussed obtaining a CT head given his fall and unclear history of head trauma, but the patient insisted he felt fine without any focal tenderness to his head. Discussed the importance of early detection of bleeds but ultimately the patient opted to  forgo a CT head.  - The patient was AAOx3. He was hemodynamically stable with negative orthostatics and no focal neurologic deficits at the time of discharge.    # Left leg pain - Patient reports acute onset left leg pain after jumping and landing on his left leg.  Pain is persistent, he is unable to flex his knee or bear weight. - X-rays of  left femur and left knee showed no acute bony abnormalities - PT OT were consulted on admission but the patient opted to be discharged prior to being evaluated. - Crutches were provided to the patient to aid in ambulation - He was instructed to follow up with his PCP for further evaluation of his leg, possibly to include advanced imaging. He reported that he will be going to the [facility] tomorrow to be evaluated.  - Recommended cycling between Tylenol 1,000 mg and Motrin  800 mg taken with food every 4 hours  #Primary hypertension #Hypotension -- resolved - Per EMS's report, patient hypotensive to 60/40 on their arrival.  Improved to 146/80 with 500 mL bolus of fluids. - Patient hypertensive this morning with SBP in the 160s - Home amlodipine  10 mg daily resumed   # Lactic acidosis, type B - Presented with elevated lactic acid to 4.6 with worsening to 4.9 before starting to trend down slowly. - Thought to be multifactorial, potentially with hypotension, metformin use, NAFLD with poor lactic acid clearance, and concomitant alcohol use playing a role. - Maintain that low likelihood of sepsis driving lactic acidosis initially - Received 1/2 amp bicarb push and approximately 3.5L of isotonic fluids - Lactic acid trended down to 2.0 - Recommend follow up with PCP for repeat labs   # AKI - resolved - Cr elevated to 1.32 on admission - Most likely prerenal in the setting of dehydration and hypotension - Improved with IVF's - Cr 1.05 at the time of discharge   #Alcohol abuse - Patient reports drinking significant alcohol prior to admission including 7 shots of vodka and 2 cans of beer. - He reports drinking 5 to 6 airplane bottles of liquor on a daily basis. Denies any previous history of withdrawal. - Was maintained CIWA protocol with as needed Ativan - Received folic acid and thiamine supplementation - Strongly urged the patient to discuss alcohol cessation strategies with his PCP   #High anion  gap metabolic acidosis  - Presented with bicarb 16, corrected anion gap of 17.5, delta ratio of 0.7  - Likely uncomplicated HAGMA driven by lactic acidosis with delta ratio < 1 due to concomitant acute renal failure - Improved with IV hydration   #T2DM - Home metformin initially held the setting of lactic acidosis but was resumed on discharge given resolution    #History of NAFLD #Elevated LFTs - AST and ALT improving to 52 and 55 respectively - Patient would benefit from PCP follow-up to discuss lifestyle modifications and weight loss, repeat LFTs, and possible dedicated liver imaging    # Class II Obesity (BMI Body mass index is 38.92 kg/m. kg/m) - Obesity is clinically significant and contributes to T2DM, HTN, HLD, OSA, and NAFLD/NASH.  - Discussed lifestyle modification including dietary changes, regular physical activity, and weight reduction strategies. Will continue to monitor weight and BMI during hospitalization.     Consultants: None Procedures performed: None  Disposition: Home Diet recommendation:  Regular diet DISCHARGE MEDICATION: Allergies as of 04/11/2024       Reactions   Doxazosin    Dizziness  Jardiance [empagliflozin]    headache   Seroquel [quetiapine]    Hallucinations    Sildenafil    Headache   Penicillins Rash        Medication List     TAKE these medications    amLODipine  10 MG tablet Commonly known as: NORVASC  Take 10 mg by mouth daily.   aspirin EC 81 MG tablet Take 81 mg by mouth daily. Swallow whole.   ibuprofen  800 MG tablet Commonly known as: ADVIL  Take 1 tablet (800 mg total) by mouth every 8 (eight) hours as needed for moderate pain (pain score 4-6).   metFORMIN 500 MG tablet Commonly known as: GLUCOPHAGE Take 500 mg by mouth 2 (two) times daily with a meal.               Durable Medical Equipment  (From admission, onward)           Start     Ordered   04/11/24 0000  DME Crutches        04/11/24 1749             Follow-up Information     [facility]. Schedule an appointment as soon as possible for a visit in 1 week(s).   Specialty: General Practice Contact information: [address] [place] [address] [phone]                Discharge Exam: Fredricka Weights   04/10/24 1700  Weight: 133.8 kg   Physical Exam Constitutional:      General: He is not in acute distress.    Appearance: He is obese. He is not ill-appearing.  HENT:     Mouth/Throat:     Mouth: Mucous membranes are moist.  Eyes:     Pupils: Pupils are equal, round, and reactive to light.  Cardiovascular:     Rate and Rhythm: Normal rate and regular rhythm.     Heart sounds: Normal heart sounds. No murmur heard. Pulmonary:     Effort: Pulmonary effort is normal. No respiratory distress.     Breath sounds: Rhonchi (Faint bilaterally) present. No wheezing.  Abdominal:     General: Bowel sounds are normal. There is no distension.     Palpations: Abdomen is soft.     Tenderness: There is no abdominal tenderness. There is no guarding.  Musculoskeletal:     Left upper leg: Tenderness (Focal tenderness over the left lateral distal femur above the knee.) present.     Left knee: No swelling or bony tenderness. Decreased range of motion. No tenderness.     Right lower leg: 1+ Pitting Edema present.     Left lower leg: No swelling or tenderness (Focal tenderness over the left lateral distal femur above the lateral knee.). 1+ Pitting Edema present.       Legs:  Skin:    General: Skin is warm and dry.     Capillary Refill: Capillary refill takes less than 2 seconds.  Neurological:     Mental Status: He is alert and oriented to person, place, and time. Mental status is at baseline.     GCS: GCS eye subscore is 4. GCS verbal subscore is 5. GCS motor subscore is 6.     Sensory: Sensation is intact.     Motor: Motor function is intact.     Coordination: Coordination is intact.   Condition at  discharge: good  The results of significant diagnostics from this hospitalization (including imaging, microbiology, ancillary and laboratory) are  listed below for reference.   Imaging Studies: ECHOCARDIOGRAM COMPLETE Result Date: 04/11/2024    ECHOCARDIOGRAM REPORT   Patient Name:   BRUCE CHURILLA Date of Exam: 04/11/2024 Medical Rec #:  [id number]       Height:       73.0 in Accession #:    [id number]      Weight:       295.0 lb Date of Birth:  16-Sep-1965        BSA:          2.538 m Patient Age:    58 years        BP:           173/95 mmHg Patient Gender: M               HR:           96 bpm. Exam Location:  Inpatient Procedure: 2D Echo (Both Spectral and Color Flow Doppler were utilized during            procedure). Indications:    Syncope  History:        Patient has no prior history of Echocardiogram examinations.                 Signs/Symptoms:Syncope.  Sonographer:    Norleen Amour Referring Phys: [id number] SUBRINA SUNDIL IMPRESSIONS  1. Left ventricular ejection fraction, by estimation, is 60 to 65%. The left ventricle has normal function. The left ventricle has no regional wall motion abnormalities. Left ventricular diastolic parameters were normal.  2. Right ventricular systolic function is normal. The right ventricular size is normal.  3. The mitral valve is normal in structure. No evidence of mitral valve regurgitation. No evidence of mitral stenosis.  4. The aortic valve is tricuspid. Aortic valve regurgitation is not visualized. No aortic stenosis is present. Comparison(s): No prior Echocardiogram. Conclusion(s)/Recommendation(s): No left ventricular mural or apical thrombus/thrombi. FINDINGS  Left Ventricle: Left ventricular ejection fraction, by estimation, is 60 to 65%. The left ventricle has normal function. The left ventricle has no regional wall motion abnormalities. The left ventricular internal cavity size was normal in size. There is  no left ventricular hypertrophy of the basal-septal  segment. Left ventricular diastolic parameters were normal. Right Ventricle: The right ventricular size is normal. Right vetricular wall thickness was not well visualized. Right ventricular systolic function is normal. Left Atrium: Left atrial size was normal in size. Right Atrium: Right atrial size was normal in size. Pericardium: There is no evidence of pericardial effusion. Mitral Valve: The mitral valve is normal in structure. No evidence of mitral valve regurgitation. No evidence of mitral valve stenosis. Tricuspid Valve: The tricuspid valve is normal in structure. Tricuspid valve regurgitation is not demonstrated. No evidence of tricuspid stenosis. Aortic Valve: The aortic valve is tricuspid. Aortic valve regurgitation is not visualized. No aortic stenosis is present. Pulmonic Valve: The pulmonic valve was normal in structure. Pulmonic valve regurgitation is not visualized. No evidence of pulmonic stenosis. Aorta: The aortic root and ascending aorta are structurally normal, with no evidence of dilitation. Venous: The inferior vena cava was not well visualized. IAS/Shunts: The interatrial septum was not well visualized.  LEFT VENTRICLE PLAX 2D LVIDd:         4.10 cm      Diastology LVIDs:         2.50 cm      LV e' medial:    9.57 cm/s LV PW:  1.00 cm      LV E/e' medial:  12.1 LV IVS:        0.80 cm      LV e' lateral:   13.40 cm/s LVOT diam:     2.30 cm      LV E/e' lateral: 8.7 LV SV:         88 LV SV Index:   35 LVOT Area:     4.15 cm  LV Volumes (MOD) LV vol d, MOD A2C: 95.0 ml LV vol d, MOD A4C: 130.0 ml LV vol s, MOD A2C: 32.4 ml LV vol s, MOD A4C: 37.7 ml LV SV MOD A2C:     62.6 ml LV SV MOD A4C:     130.0 ml LV SV MOD BP:      78.6 ml RIGHT VENTRICLE RV Basal diam:  3.20 cm LEFT ATRIUM             Index        RIGHT ATRIUM           Index LA diam:        3.10 cm 1.22 cm/m   RA Area:     14.10 cm LA Vol (A2C):   44.2 ml 17.41 ml/m  RA Volume:   37.10 ml  14.62 ml/m LA Vol (A4C):   47.3 ml  18.64 ml/m LA Biplane Vol: 45.0 ml 17.73 ml/m  AORTIC VALVE             PULMONIC VALVE LVOT Vmax:   103.00 cm/s PV Vmax:       0.95 m/s LVOT Vmean:  73.500 cm/s PV Peak grad:  3.6 mmHg LVOT VTI:    0.213 m  AORTA Ao Root diam: 3.50 cm Ao Asc diam:  3.30 cm MITRAL VALVE MV Area (PHT): 4.12 cm     SHUNTS MV Decel Time: 184 msec     Systemic VTI:  0.21 m MV E velocity: 116.00 cm/s  Systemic Diam: 2.30 cm MV A velocity: 90.40 cm/s MV E/A ratio:  1.28 Sunit Tolia Electronically signed by Madonna Large Signature Date/Time: 04/11/2024/4:23:24 PM    Final    DG Femur Min 2 Views Left Result Date: 04/10/2024 CLINICAL DATA:  Left leg pain EXAM: LEFT FEMUR 2 VIEWS COMPARISON:  None Available. FINDINGS: No acute bony abnormality. Specifically, no fracture, subluxation, or dislocation. Small bone density in the suprapatellar region appears well corticated and likely related to old injury. IMPRESSION: No acute bony abnormality. Electronically Signed   By: Franky Crease M.D.   On: 04/10/2024 20:34   DG Knee Left Port Result Date: 04/10/2024 CLINICAL DATA:  Knee pain, fall EXAM: PORTABLE LEFT KNEE - 1-2 VIEW COMPARISON:  None Available. FINDINGS: There is a small bone density superior to the patella which appears well corticated and felt to reflect changes from old injury. No acute fracture, subluxation or dislocation. Joint spaces maintained. No joint effusion. IMPRESSION: No acute bony abnormality. Electronically Signed   By: Franky Crease M.D.   On: 04/10/2024 17:23   DG Chest Port 1 View Result Date: 04/10/2024 CLINICAL DATA:  Status post fall EXAM: PORTABLE CHEST 1 VIEW COMPARISON:  Chest radiograph dated 06/07/2014 FINDINGS: Normal lung volumes. No focal consolidations. No pleural effusion or pneumothorax. The heart size and mediastinal contours are within normal limits. No radiographic finding of acute displaced fracture. IMPRESSION: No active disease. Electronically Signed   By: Limin  Xu M.D.   On: 04/10/2024  17:22   CT CHEST LUNG  CA SCREEN LOW DOSE W/O CM Result Date: 03/31/2024 CLINICAL DATA:  Lung cancer screening, current smoker. Eighteen pack-year history. EXAM: CT CHEST WITHOUT CONTRAST LOW-DOSE FOR LUNG CANCER SCREENING TECHNIQUE: Multidetector CT imaging of the chest was performed following the standard protocol without IV contrast. RADIATION DOSE REDUCTION: This exam was performed according to the departmental dose-optimization program which includes automated exposure control, adjustment of the mA and/or kV according to patient size and/or use of iterative reconstruction technique. COMPARISON:  Lung cancer screening CT 04/24/2023 performed at an outside institution. FINDINGS: Cardiovascular: The heart is normal in size. There are coronary artery calcifications. No pericardial effusion. Mild aortic atherosclerosis without aneurysm. Mediastinum/Nodes: No enlarged mediastinal lymph nodes. Few prominent axillary nodes are stable from prior exam. No obvious bulky hilar adenopathy on this unenhanced exam. No visible thyroid  nodule. No esophageal wall thickening. Lungs/Pleura: Minimal emphysema and central bronchial thickening. No consolidative airspace disease. No suspicious pulmonary nodules. Faint subpleural reticulation within the anterior right middle lobe. Upper Abdomen: Hepatic steatosis. No acute upper abdominal findings. Musculoskeletal: There are no acute or suspicious osseous abnormalities. Thoracic spondylosis with anterior spurring. IMPRESSION: 1. Lung-RADS 1, negative. Continue annual screening with low-dose chest CT without contrast in 12 months. 2. Coronary artery calcifications. 3. Aortic Atherosclerosis (ICD10-I70.0) and Emphysema (ICD10-J43.9). Electronically Signed   By: Andrea Gasman M.D.   On: 03/31/2024 12:05    Microbiology: Results for orders placed or performed during the hospital encounter of 12/23/20  Urine culture     Status: Abnormal   Collection Time: 12/23/20 11:08 AM    Specimen: Urine, Clean Catch  Result Value Ref Range Status   Specimen Description URINE, CLEAN CATCH  Final   Special Requests NONE  Final   Culture (A)  Final    40,000 COLONIES/mL STREPTOCOCCUS AGALACTIAE TESTING AGAINST S. AGALACTIAE NOT ROUTINELY PERFORMED DUE TO PREDICTABILITY OF AMP/PEN/VAN SUSCEPTIBILITY. Performed at [facility], [address], [place], [place] [address]    Report Status 12/24/2020 FINAL  Final    Labs: CBC: Recent Labs  Lab 04/10/24 1702 04/10/24 1733 04/10/24 2019 04/10/24 2216 04/11/24 0506  WBC 7.1  --   --   --  7.2  NEUTROABS 3.1  --   --   --   --   HGB 13.7 13.9 14.6 13.3 12.7*  HCT 42.7 41.0 43.0 39.0 38.9*  MCV 92.6  --   --   --  90.7  PLT 169  --   --   --  159   Basic Metabolic Panel: Recent Labs  Lab 04/10/24 1702 04/10/24 1733 04/10/24 2019 04/10/24 2216 04/11/24 0212  NA 135 142 138 138 131*  K 5.0 3.7 4.3 3.8 3.8  CL 103 106  --   --  101  CO2 16*  --   --   --  17*  GLUCOSE 170* 159*  --   --  228*  BUN 15 15  --   --  11  CREATININE 1.32* 1.30*  --   --  1.05  CALCIUM 8.5*  --   --   --  8.0*   Liver Function Tests: Recent Labs  Lab 04/10/24 1702 04/11/24 0212  AST 70* 52*  ALT 69* 55*  ALKPHOS 87 70  BILITOT 0.6 0.6  PROT 6.6 5.6*  ALBUMIN 3.4* 2.9*   CBG: Recent Labs  Lab 04/10/24 2356 04/11/24 0750 04/11/24 1226  GLUCAP 163* 164* 148*    Discharge time spent: 60 minutes.  Signed: Kenza Munar Al-Sultani,  MD [facility] 04/11/2024
